# Patient Record
Sex: Female | Born: 2000 | Hispanic: No | Marital: Single | Smoking: Never smoker
Health system: Southern US, Community
[De-identification: ages and names within clinical notes are randomized; demographics above are authoritative.]

## PROBLEM LIST (undated history)

## (undated) ENCOUNTER — Ambulatory Visit: Admission: EM | Payer: Self-pay

## (undated) DIAGNOSIS — F419 Anxiety disorder, unspecified: Secondary | ICD-10-CM

## (undated) HISTORY — DX: Anxiety disorder, unspecified: F41.9

---
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
## 2018-01-03 ENCOUNTER — Encounter (INDEPENDENT_AMBULATORY_CARE_PROVIDER_SITE_OTHER): Payer: Self-pay | Admitting: Pediatric Gastroenterology

## 2018-02-17 ENCOUNTER — Other Ambulatory Visit: Payer: Self-pay

## 2018-02-17 ENCOUNTER — Ambulatory Visit (INDEPENDENT_AMBULATORY_CARE_PROVIDER_SITE_OTHER): Admitting: *Deleted

## 2018-02-17 DIAGNOSIS — Z3042 Encounter for surveillance of injectable contraceptive: Secondary | ICD-10-CM

## 2018-05-20 ENCOUNTER — Other Ambulatory Visit: Payer: Self-pay

## 2018-05-20 ENCOUNTER — Ambulatory Visit (INDEPENDENT_AMBULATORY_CARE_PROVIDER_SITE_OTHER)

## 2018-05-20 ENCOUNTER — Other Ambulatory Visit: Payer: Self-pay | Admitting: Family Medicine

## 2018-05-20 DIAGNOSIS — Z3042 Encounter for surveillance of injectable contraceptive: Secondary | ICD-10-CM

## 2018-05-20 MED ORDER — MEDROXYPROGESTERONE ACETATE 150 MG/ML IM SUSP: 150.0000 mg | Freq: Once | INTRAMUSCULAR | 0 refills | Status: DC

## 2018-08-19 ENCOUNTER — Ambulatory Visit (INDEPENDENT_AMBULATORY_CARE_PROVIDER_SITE_OTHER)

## 2018-08-19 DIAGNOSIS — Z3042 Encounter for surveillance of injectable contraceptive: Secondary | ICD-10-CM | POA: Diagnosis not present

## 2018-08-19 NOTE — Progress Notes (Signed)
Patient came in today for 3 month depo provera injections. Depo provera was given in the left deltoid. Patient tolerated well. Informed her next one due 11/04/18-11/18/2018.

## 2018-08-29 ENCOUNTER — Ambulatory Visit: Admitting: Physician Assistant

## 2018-08-30 ENCOUNTER — Ambulatory Visit (INDEPENDENT_AMBULATORY_CARE_PROVIDER_SITE_OTHER): Admitting: Family Medicine

## 2018-08-30 ENCOUNTER — Encounter: Payer: Self-pay | Admitting: Family Medicine

## 2018-08-30 ENCOUNTER — Other Ambulatory Visit: Payer: Self-pay

## 2018-08-30 VITALS — BP 118/60 | HR 89 | Temp 98.8°F | Resp 14 | Ht 68.0 in | Wt 174.0 lb

## 2018-08-30 DIAGNOSIS — N760 Acute vaginitis: Secondary | ICD-10-CM

## 2018-08-30 DIAGNOSIS — Z113 Encounter for screening for infections with a predominantly sexual mode of transmission: Secondary | ICD-10-CM

## 2018-08-30 LAB — WET PREP FOR TRICH, YEAST, CLUE

## 2018-08-30 MED ORDER — METRONIDAZOLE 500 MG PO TABS: 500.0000 mg | ORAL_TABLET | Freq: Two times a day (BID) | ORAL | 0 refills | Status: AC

## 2018-08-30 MED ORDER — FLUCONAZOLE 150 MG PO TABS: 150.0000 mg | ORAL_TABLET | Freq: Once | ORAL | 1 refills | Status: AC

## 2018-08-30 NOTE — Progress Notes (Signed)
Patient ID: Christina Conner, female    DOB: May 16, 2001, 17 y.o.   MRN: [id number]  PCP: Salley Scarlet, MD  Chief Complaint  Patient presents with  . Vaginitis    x2 weeks- thought she had yeast infection and triedc OTC meds with no relief  . STD Check    Subjective:   Christina Conner is a 17 y.o. female, presents to clinic with CC of vaginitis x 2 weeks Vaginal Pain  The patient's primary symptoms include genital itching and vaginal discharge. The patient's pertinent negatives include no genital lesions, genital odor, genital rash, missed menses, pelvic pain or vaginal bleeding. This is a new problem. The current episode started 1 to 4 weeks ago (2 weeks). The problem has been unchanged. The pain is moderate. The problem affects both sides. She is not pregnant. Associated symptoms include dysuria (burning to genital skin with urination). Pertinent negatives include no abdominal pain, back pain, chills, diarrhea, discolored urine, fever, flank pain, frequency, hematuria, joint pain, joint swelling, nausea, painful intercourse, rash, sore throat, urgency or vomiting. The vaginal discharge was scant and white. There has been no bleeding. She has not been passing clots. She has not been passing tissue. Nothing aggravates the symptoms. She has tried antifungals (AZO yeast infection pills) for the symptoms. The treatment provided no relief. She is sexually active. It is possible that her partner has an STD. She uses condoms for contraception. Her menstrual history has been irregular (on depo provera birth control shot). Her past medical history is significant for vaginosis. There is no history of an abdominal surgery, a Cesarean section, an ectopic pregnancy, a gynecological surgery, PID, an STD or a terminated pregnancy.   Vaginal intercourse with one female partner 3 weeks ago, used condoms, pt unsure if she has had other recent partners.  Her symptoms began 1 week after mostly vaginal irritation and  itching and burning.  She has had BV in the past but this is not similar to that.  She does suspected to yeast infection but over-the-counter medications have not improved her symptoms.  She denies any fever, nausea, vomiting, back pain, abdominal pain, pelvic pain, anorexia, hot cold chills or sweats.   Patient Active Problem List   Diagnosis Date Noted  . Tinea versicolor 06/09/2016  . Contraception management 12/18/2013  . Chronic constipation 12/18/2013  . Other malaise and fatigue 04/19/2013  . Recurrent upper abdominal pain 04/19/2013  . Dizziness and giddiness 04/19/2013     Prior to Admission medications   Medication Sig Start Date End Date Taking? Authorizing Provider  colchicine 0.6 MG tablet For Gout Flare: Take 2. Then take 1 one hour later. Then take 1 twice a day until flare resolves. 07/22/17  Yes Dorena Bodo, PA-C  medroxyPROGESTERone (DEPO-PROVERA) 150 MG/ML injection Inject 1 mL (150 mg total) into the muscle once for 1 dose. 05/20/18 05/20/18  Salley Scarlet, MD  valACYclovir (VALTREX) 1000 MG tablet For 3 days Patient not taking: Reported on 08/30/2018 05/03/17   Salley Scarlet, MD     No Known Allergies   History reviewed. No pertinent family history.   Social History   Socioeconomic History  . Marital status: Single    Spouse name: Not on file  . Number of children: Not on file  . Years of education: Not on file  . Highest education level: Not on file  Occupational History  . Not on file  Social Needs  . Financial resource strain: Not  on file  . Food insecurity:    Worry: Not on file    Inability: Not on file  . Transportation needs:    Medical: Not on file    Non-medical: Not on file  Tobacco Use  . Smoking status: Passive Smoke Exposure - Never Smoker  . Smokeless tobacco: Never Used  Substance and Sexual Activity  . Alcohol use: No    Alcohol/week: 0.0 standard drinks  . Drug use: No  . Sexual activity: Never  Lifestyle  . Physical  activity:    Days per week: Not on file    Minutes per session: Not on file  . Stress: Not on file  Relationships  . Social connections:    Talks on phone: Not on file    Gets together: Not on file    Attends religious service: Not on file    Active member of club or organization: Not on file    Attends meetings of clubs or organizations: Not on file    Relationship status: Not on file  . Intimate partner violence:    Fear of current or ex partner: Not on file    Emotionally abused: Not on file    Physically abused: Not on file    Forced sexual activity: Not on file  Other Topics Concern  . Not on file  Social History Narrative  . Not on file     Review of Systems  Constitutional: Negative.  Negative for activity change, appetite change, chills, diaphoresis, fatigue, fever and unexpected weight change.  HENT: Negative.  Negative for sore throat.   Eyes: Negative.   Respiratory: Negative.   Cardiovascular: Negative.   Gastrointestinal: Negative.  Negative for abdominal pain, diarrhea, nausea and vomiting.  Endocrine: Negative.   Genitourinary: Positive for dysuria (burning to genital skin with urination), vaginal discharge and vaginal pain. Negative for flank pain, frequency, hematuria, missed menses, pelvic pain and urgency.  Musculoskeletal: Negative.  Negative for back pain and joint pain.  Skin: Negative.  Negative for color change, pallor, rash and wound.  Allergic/Immunologic: Negative.   Neurological: Negative.   Hematological: Negative.   Psychiatric/Behavioral: Negative.   All other systems reviewed and are negative.      Objective:    Vitals:   08/30/18 1141  BP: 118/60  Pulse: 89  Resp: 14  Temp: 98.8 F (37.1 C)  TempSrc: Oral  SpO2: 98%  Weight: 174 lb (78.9 kg)  Height: 5\' 8"  (1.727 m)      Physical Exam  Constitutional: She appears well-developed and well-nourished. No distress.  HENT:  Head: Normocephalic and atraumatic.  Nose: Nose  normal.  Mouth/Throat: Oropharynx is clear and moist. No oropharyngeal exudate.  Eyes: Conjunctivae are normal. Right eye exhibits no discharge. Left eye exhibits no discharge.  Neck: No tracheal deviation present.  Cardiovascular: Normal rate, regular rhythm, normal heart sounds and intact distal pulses. Exam reveals no gallop and no friction rub.  No murmur heard. Pulmonary/Chest: Effort normal and breath sounds normal. No stridor. No respiratory distress. She has no wheezes. She has no rales. She exhibits no tenderness.  Abdominal: Soft. Bowel sounds are normal. She exhibits no distension and no mass. There is no tenderness. There is no rebound and no guarding.  No CVA tenderness bilaterally  Genitourinary: Vagina normal. Pelvic exam was performed with patient supine. No labial fusion. There is no rash, tenderness, lesion or injury on the right labia. There is no rash, tenderness, lesion or injury on the left labia. No  erythema, tenderness or bleeding in the vagina. No signs of injury around the vagina. No vaginal discharge found.  Genitourinary Comments: Vaginal swabs for wet prep and STD's obtained with chaperon Christina Six, LPN  Musculoskeletal: Normal range of motion.  Lymphadenopathy: No inguinal adenopathy noted on the right or left side.  Neurological: She is alert. She exhibits normal muscle tone. Coordination normal.  Skin: Skin is warm and dry. No rash noted. She is not diaphoretic.  Psychiatric: She has a normal mood and affect. Her behavior is normal.  Nursing note and vitals reviewed.   WET PREP FOR TRICH, YEAST, CLUE  Order: [id number]  Status:  Final result Visible to patient:  No (Not Released) Next appt:  None Dx:  Acute vaginitis  Specimen Information: Vaginal Mucosa; Body Fluid     Component 12:09  Source: GENITAL   RESULT   Comment: TRICHOMONAS-NONE SEEN  YEAST-NONE SEEN  CLUE CELLS-PRESENT  EPITHELIAL CELLS-PRESENT  BACTERIA-MODERATE  WBCS-FEW               Assessment & Plan:      ICD-10-CM   1. Acute vaginitis N76.0 Trichomonas vaginalis, RNA    C. trachomatis/N. gonorrhoeae RNA    WET PREP FOR TRICH, YEAST, CLUE    fluconazole (DIFLUCAN) 150 MG tablet    metroNIDAZOLE (FLAGYL) 500 MG tablet  2. Screen for STD (sexually transmitted disease) Z11.3 HIV Antibody (routine testing w rflx)    RPR    Patient with 2 weeks of vaginitis with burning and itching, following sex with a female partner 3 weeks ago, reports to have used condoms as protection, patient does wish for STD testing today.  Her physical exam is fairly unremarkable, no abdominal, pelvic or vaginal pain, do not suspect PID given her presentation and physical exam.  Will cover for yeast and BV for her symptoms and wet prep results.  She will be called with remaining STD results.   Danelle Berry, PA-C 08/30/18 11:54 AM

## 2018-08-31 LAB — HIV ANTIBODY (ROUTINE TESTING W REFLEX): HIV: NONREACTIVE

## 2018-08-31 LAB — RPR: RPR: NONREACTIVE

## 2018-08-31 LAB — C. TRACHOMATIS/N. GONORRHOEAE RNA
C. trachomatis RNA, TMA: NOT DETECTED
N. GONORRHOEAE RNA, TMA: NOT DETECTED

## 2018-08-31 LAB — TRICHOMONAS VAGINALIS, PROBE AMP: Trichomonas vaginalis RNA: NOT DETECTED

## 2018-11-21 ENCOUNTER — Ambulatory Visit (INDEPENDENT_AMBULATORY_CARE_PROVIDER_SITE_OTHER)

## 2018-11-21 ENCOUNTER — Other Ambulatory Visit: Payer: Self-pay | Admitting: Family Medicine

## 2018-11-21 DIAGNOSIS — Z3042 Encounter for surveillance of injectable contraceptive: Secondary | ICD-10-CM

## 2018-11-21 NOTE — Progress Notes (Signed)
Patient came in and to receive depo provera. Depo provera was given in the right deltoid. Patient tolerated well. Informed patient to return March 24th- April 7th.

## 2019-01-09 ENCOUNTER — Other Ambulatory Visit: Payer: Self-pay

## 2019-01-09 ENCOUNTER — Ambulatory Visit (INDEPENDENT_AMBULATORY_CARE_PROVIDER_SITE_OTHER): Admitting: Family Medicine

## 2019-01-09 ENCOUNTER — Encounter: Payer: Self-pay | Admitting: Family Medicine

## 2019-01-09 VITALS — BP 112/62 | HR 80 | Temp 98.2°F | Resp 16 | Ht 68.0 in | Wt 184.0 lb

## 2019-01-09 DIAGNOSIS — H547 Unspecified visual loss: Secondary | ICD-10-CM

## 2019-01-09 DIAGNOSIS — R635 Abnormal weight gain: Secondary | ICD-10-CM

## 2019-01-09 DIAGNOSIS — G44309 Post-traumatic headache, unspecified, not intractable: Secondary | ICD-10-CM

## 2019-01-09 DIAGNOSIS — R5383 Other fatigue: Secondary | ICD-10-CM | POA: Diagnosis not present

## 2019-01-09 MED ORDER — NAPROXEN 500 MG PO TABS: 500.0000 mg | ORAL_TABLET | Freq: Two times a day (BID) | ORAL | 0 refills | Status: DC | PRN
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
## 2019-01-09 NOTE — Patient Instructions (Addendum)
Take naprosyn  We will call with lab results Recommend eye visit  F/U pending results

## 2019-01-10 LAB — CBC WITH DIFFERENTIAL/PLATELET
Absolute Monocytes: 640 cells/uL (ref 200–950)
BASOS ABS: 32 {cells}/uL (ref 0–200)
Basophils Relative: 0.4 %
EOS PCT: 0.8 %
Eosinophils Absolute: 63 cells/uL (ref 15–500)
HCT: 39.7 % (ref 35.0–45.0)
Hemoglobin: 13.3 g/dL (ref 11.7–15.5)
Lymphs Abs: 2125 cells/uL (ref 850–3900)
MCH: 26.6 pg — ABNORMAL LOW (ref 27.0–33.0)
MCHC: 33.5 g/dL (ref 32.0–36.0)
MCV: 79.4 fL — ABNORMAL LOW (ref 80.0–100.0)
MPV: 9.7 fL (ref 7.5–12.5)
Monocytes Relative: 8.1 %
Neutro Abs: 5040 cells/uL (ref 1500–7800)
Neutrophils Relative %: 63.8 %
Platelets: 335 10*3/uL (ref 140–400)
RBC: 5 10*6/uL (ref 3.80–5.10)
RDW: 13.4 % (ref 11.0–15.0)
Total Lymphocyte: 26.9 %
WBC: 7.9 10*3/uL (ref 3.8–10.8)

## 2019-01-10 LAB — COMPREHENSIVE METABOLIC PANEL
AG Ratio: 1.7 (calc) (ref 1.0–2.5)
ALT: 20 U/L (ref 5–32)
AST: 18 U/L (ref 12–32)
Albumin: 4.4 g/dL (ref 3.6–5.1)
Alkaline phosphatase (APISO): 88 U/L (ref 36–128)
BUN: 13 mg/dL (ref 7–20)
CO2: 23 mmol/L (ref 20–32)
Calcium: 9.2 mg/dL (ref 8.9–10.4)
Chloride: 107 mmol/L (ref 98–110)
Creat: 0.77 mg/dL (ref 0.50–1.00)
Globulin: 2.6 g/dL (calc) (ref 2.0–3.8)
Glucose, Bld: 101 mg/dL — ABNORMAL HIGH (ref 65–99)
POTASSIUM: 4.6 mmol/L (ref 3.8–5.1)
Sodium: 142 mmol/L (ref 135–146)
Total Bilirubin: 0.3 mg/dL (ref 0.2–1.1)
Total Protein: 7 g/dL (ref 6.3–8.2)

## 2019-01-10 LAB — TSH: TSH: 0.93 mIU/L

## 2019-02-13 ENCOUNTER — Other Ambulatory Visit: Payer: Self-pay

## 2019-02-13 ENCOUNTER — Encounter (INDEPENDENT_AMBULATORY_CARE_PROVIDER_SITE_OTHER): Admitting: Family Medicine

## 2019-02-13 NOTE — Progress Notes (Signed)
Virtual Visit via Telephone Note  I connected with Christina Conner on 02/13/19 at  by telephone and verified that I am speaking with the correct person using two identifiers.   I discussed the limitations, risks, security and privacy concerns of performing an evaluation and management service by telephone and the availability of in person appointments. I also discussed with the patient that there may be a patient responsible charge related to this service. The patient expressed understanding and agreed to proceed.   [phone]- Called  Patient twice and home number, father states she is at work   pt did not answer, can reschedule call

## 2019-02-16 ENCOUNTER — Ambulatory Visit (INDEPENDENT_AMBULATORY_CARE_PROVIDER_SITE_OTHER): Admitting: *Deleted

## 2019-02-16 ENCOUNTER — Other Ambulatory Visit: Payer: Self-pay

## 2019-02-16 ENCOUNTER — Other Ambulatory Visit: Payer: Self-pay | Admitting: Family Medicine

## 2019-02-16 DIAGNOSIS — Z3042 Encounter for surveillance of injectable contraceptive: Secondary | ICD-10-CM

## 2019-07-18 ENCOUNTER — Other Ambulatory Visit: Payer: Self-pay

## 2019-07-18 ENCOUNTER — Ambulatory Visit (INDEPENDENT_AMBULATORY_CARE_PROVIDER_SITE_OTHER): Admitting: Family Medicine

## 2019-07-18 ENCOUNTER — Encounter: Payer: Self-pay | Admitting: Family Medicine

## 2019-07-18 VITALS — BP 98/68 | HR 92 | Temp 98.5°F | Resp 16 | Ht 68.0 in | Wt 167.2 lb

## 2019-07-18 DIAGNOSIS — N898 Other specified noninflammatory disorders of vagina: Secondary | ICD-10-CM

## 2019-07-18 DIAGNOSIS — N390 Urinary tract infection, site not specified: Secondary | ICD-10-CM | POA: Diagnosis not present

## 2019-07-18 LAB — WET PREP FOR TRICH, YEAST, CLUE

## 2019-07-18 LAB — URINALYSIS, ROUTINE W REFLEX MICROSCOPIC
Bilirubin Urine: NEGATIVE
Glucose, UA: NEGATIVE
Hgb urine dipstick: NEGATIVE
Ketones, ur: NEGATIVE
Leukocytes,Ua: NEGATIVE
Nitrite: NEGATIVE
Protein, ur: NEGATIVE
Specific Gravity, Urine: 1.021 (ref 1.001–1.03)
pH: 8.5 — ABNORMAL HIGH (ref 5.0–8.0)

## 2019-07-18 NOTE — Progress Notes (Signed)
   Subjective:    Patient ID: Christina Conner, female    DOB: 09/22/2001, 18 y.o.   MRN: [id number]  Patient presents for Vaginitis   Wend to UC in May had UTI, had odor last week. That has went away     Stopped Depo In July, has not had cycle since then  No abd pain or cramping   Vaginal discharge for a week, then it cleared up but wants to be checked, she has a girlfriend now    Declines     flu shot      Review Of Systems:  GEN- denies fatigue, fever, weight loss,weakness, recent illness HEENT- denies eye drainage, change in vision, nasal discharge, CVS- denies chest pain, palpitations RESP- denies SOB, cough, wheeze ABD- denies N/V, change in stools, abd pain GU- denies dysuria, hematuria, dribbling, incontinence MSK- denies joint pain, muscle aches, injury Neuro- denies headache, dizziness, syncope, seizure activity       Objective:    BP 98/68 (BP Location: Right Arm, Patient Position: Sitting, Cuff Size: Normal)   Pulse 92   Temp 98.5 F (36.9 C) (Oral)   Resp 16   Ht 5\' 8"  (1.727 m)   Wt 167 lb 3.2 oz (75.8 kg)   SpO2 98%   BMI 25.42 kg/m  GEN- NAD, alert and oriented x3 HEENT- PERRL, EOMI, non injected sclera, pink conjunctiva, MMM, oropharynx clear CVS- RRR, no murmur RESP-CTAB ABD-NABS,soft,NT,ND, no CVA tenderness Nodes- no axillary nodes GU- normal external genitalia, vaginal mucosa pink and moist, cervix visualized no growth, no blood form os, +  discharge, no CMT, no ovarian masses, uterus normal size EXT- No edema Pulses- Radial, DP- 2+        Assessment & Plan:      Problem List Items Addressed This Visit    None    Visit Diagnoses    Vaginal discharge    -  Primary   check wet prep, GC, not currently symptomatic, check urine ensure UTI cleared as well, declines further contraception, Depo still wearing off   Relevant Orders   C. trachomatis/N. gonorrhoeae RNA   WET PREP FOR [place], YEAST, CLUE (Completed)   Urinary tract infection  without hematuria, site unspecified       Relevant Orders   Urinalysis, Routine w reflex microscopic (Completed)      Note: This dictation was prepared with Dragon dictation along with smaller phrase technology. Any transcriptional errors that result from this process are unintentional.

## 2019-07-18 NOTE — Patient Instructions (Signed)
We will call with lab results  F/U as needed 

## 2019-07-19 ENCOUNTER — Other Ambulatory Visit: Payer: Self-pay

## 2019-07-19 LAB — C. TRACHOMATIS/N. GONORRHOEAE RNA
C. trachomatis RNA, TMA: DETECTED — AB
N. gonorrhoeae RNA, TMA: NOT DETECTED

## 2019-07-19 MED ORDER — AZITHROMYCIN 500 MG PO TABS: 1000.0000 mg | ORAL_TABLET | ORAL | 0 refills | Status: DC

## 2019-07-31 ENCOUNTER — Other Ambulatory Visit: Payer: Self-pay

## 2019-08-01 ENCOUNTER — Ambulatory Visit (INDEPENDENT_AMBULATORY_CARE_PROVIDER_SITE_OTHER): Admitting: Family Medicine

## 2019-08-01 ENCOUNTER — Encounter: Payer: Self-pay | Admitting: Family Medicine

## 2019-08-01 VITALS — BP 118/64 | HR 82 | Temp 98.6°F | Resp 14 | Ht 68.0 in | Wt 165.0 lb

## 2019-08-01 DIAGNOSIS — Z8619 Personal history of other infectious and parasitic diseases: Secondary | ICD-10-CM | POA: Diagnosis not present

## 2019-08-01 DIAGNOSIS — N898 Other specified noninflammatory disorders of vagina: Secondary | ICD-10-CM | POA: Diagnosis not present

## 2019-08-01 DIAGNOSIS — Z113 Encounter for screening for infections with a predominantly sexual mode of transmission: Secondary | ICD-10-CM | POA: Diagnosis not present

## 2019-08-01 LAB — WET PREP FOR TRICH, YEAST, CLUE

## 2019-08-01 NOTE — Progress Notes (Signed)
   Subjective:    Patient ID: Christina Conner, female    DOB: [birth year], 18 y.o.   MRN: [id number]  Patient presents for Repeat Testing   Pt here for further STD screening, had positive Chlamydia 2 weeks ago, had diffefrent partner before her current female partner. Still has some discharge, no itching or burning. Had blood tinged discharge 2 days after taking the azithromycin but that has cleared. Previously on Depo, has not had full period since her last shot which was in April.  Denies any sexual activity with any female partners does not feel like she could be pregnant      Review Of Systems:  GEN- denies fatigue, fever, weight loss,weakness, recent illness HEENT- denies eye drainage, change in vision, nasal discharge, CVS- denies chest pain, palpitations RESP- denies SOB, cough, wheeze ABD- denies N/V, change in stools, abd pain GU- denies dysuria, hematuria, dribbling, incontinence MSK- denies joint pain, muscle aches, injury Neuro- denies headache, dizziness, syncope, seizure activity       Objective:    BP 118/64   Pulse 82   Temp 98.6 F (37 C) (Oral)   Resp 14   Ht 5\' 8"  (1.727 m)   Wt 165 lb (74.8 kg)   SpO2 99%   BMI 25.09 kg/m  GEN- NAD, alert and oriented x3 HEENT- PERRL, EOMI, non injected sclera, pink conjunctiva, MMM, oropharynx clear CVS- RRR, no murmur RESP-CTAB ABD-NABS,soft,NT,NDy GU- normal external genitalia, vaginal mucosa pink and moist, cervix visualized no growth, no blood form os, + white  discharge, no CMT, no ovarian masses, uterus normal size Pulses- Radial, 2+        Assessment & Plan:      Problem List Items Addressed This Visit    None    Visit Diagnoses    History of chlamydia infection    -  Primary   S/P treatment still with discharge, recheck cultures, HIV/RPR. Expect menses to start within next month    Relevant Orders   WET PREP FOR [place], YEAST, [place] (Completed)   C. trachomatis/N. gonorrhoeae RNA   Screen for STD  (sexually transmitted disease)       Relevant Orders   WET PREP FOR [place], [place] (Completed)   C. trachomatis/N. gonorrhoeae RNA   HIV Antibody (routine testing w rflx)   RPR      Note: This dictation was prepared with Dragon dictation along with smaller phrase technology. Any transcriptional errors that result from this process are unintentional.

## 2019-08-01 NOTE — Patient Instructions (Signed)
F/U pending results   

## 2019-08-02 ENCOUNTER — Encounter: Payer: Self-pay | Admitting: Family Medicine

## 2019-08-02 LAB — RPR: RPR Ser Ql: NONREACTIVE

## 2019-08-02 LAB — HIV ANTIBODY (ROUTINE TESTING W REFLEX): HIV 1&2 Ab, 4th Generation: NONREACTIVE

## 2019-08-02 LAB — C. TRACHOMATIS/N. GONORRHOEAE RNA
C. trachomatis RNA, TMA: NOT DETECTED
N. gonorrhoeae RNA, TMA: NOT DETECTED

## 2019-10-19 ENCOUNTER — Other Ambulatory Visit: Payer: Self-pay

## 2019-10-19 ENCOUNTER — Ambulatory Visit (INDEPENDENT_AMBULATORY_CARE_PROVIDER_SITE_OTHER): Admitting: Family Medicine

## 2019-10-19 ENCOUNTER — Encounter: Payer: Self-pay | Admitting: Family Medicine

## 2019-10-19 VITALS — BP 146/92 | HR 88 | Temp 96.6°F | Resp 16 | Ht 67.0 in | Wt 160.0 lb

## 2019-10-19 DIAGNOSIS — N760 Acute vaginitis: Secondary | ICD-10-CM | POA: Diagnosis not present

## 2019-10-19 LAB — WET PREP FOR TRICH, YEAST, CLUE

## 2019-10-19 MED ORDER — METRONIDAZOLE 500 MG PO TABS: 500.0000 mg | ORAL_TABLET | Freq: Two times a day (BID) | ORAL | 0 refills | Status: DC

## 2019-10-19 NOTE — Addendum Note (Signed)
Addended by: Shary Decamp B on: 10/19/2019 04:05 PM   Modules accepted: Orders

## 2019-10-19 NOTE — Progress Notes (Signed)
Subjective:    Patient ID: Christina Conner, female    DOB: 2000-12-21, 18 y.o.   MRN: [id number]  HPI  Patient is a very pleasant 18 year old female here today complaining of foul-smelling discharge.  She also reports a strong odor whenever she urinates.  She denies any dysuria.  She denies any urgency or frequency.  She denies any hematuria.  She denies any pelvic pain.  Pelvic exam was performed today with a chaperone present and the patient had copious amounts of foul-smelling vaginal discharge but no adnexal or cervical motion tenderness.  Wet prep did show numerous clue cells.  Gonorrhea and Chlamydia testing was also sent but is not back at the time of this dictation. No past medical history on file. Past Surgical History:  Procedure Laterality Date  . EYE SURGERY Left 07/17/2014   No current outpatient medications on file prior to visit.   No current facility-administered medications on file prior to visit.    No Known Allergies Social History   Socioeconomic History  . Marital status: Single    Spouse name: Not on file  . Number of children: Not on file  . Years of education: Not on file  . Highest education level: Not on file  Occupational History  . Not on file  Social Needs  . Financial resource strain: Not on file  . Food insecurity    Worry: Not on file    Inability: Not on file  . Transportation needs    Medical: Not on file    Non-medical: Not on file  Tobacco Use  . Smoking status: Passive Smoke Exposure - Never Smoker  . Smokeless tobacco: Never Used  Substance and Sexual Activity  . Alcohol use: No    Alcohol/week: 0.0 standard drinks  . Drug use: No  . Sexual activity: Never  Lifestyle  . Physical activity    Days per week: Not on file    Minutes per session: Not on file  . Stress: Not on file  Relationships  . Social Musician on phone: Not on file    Gets together: Not on file    Attends religious service: Not on file    Active member  of club or organization: Not on file    Attends meetings of clubs or organizations: Not on file    Relationship status: Not on file  . Intimate partner violence    Fear of current or ex partner: Not on file    Emotionally abused: Not on file    Physically abused: Not on file    Forced sexual activity: Not on file  Other Topics Concern  . Not on file  Social History Narrative  . Not on file     Review of Systems  All other systems reviewed and are negative.      Objective:   Physical Exam Vitals signs reviewed. Exam conducted with a chaperone present.  Cardiovascular:     Rate and Rhythm: Normal rate and regular rhythm.  Pulmonary:     Effort: Pulmonary effort is normal.     Breath sounds: Normal breath sounds.  Abdominal:     General: Abdomen is flat. Bowel sounds are normal. There is no distension.     Palpations: Abdomen is soft.     Tenderness: There is no abdominal tenderness.  Genitourinary:    Exam position: Lithotomy position.     Vagina: Vaginal discharge present.     Cervix: Discharge present.  Uterus: Normal.      Adnexa: Right adnexa normal and left adnexa normal.           Assessment & Plan:  Acute vaginitis - Plan: WET PREP FOR TRICH, YEAST, CLUE, C. trachomatis/N. gonorrhoeae RNA  Symptoms appear to be consistent with bacterial vaginosis.  Begin Flagyl 500 mg p.o. twice daily for 7 days.  Consider Hylafem if symptoms become recurrent after menses.

## 2019-10-20 LAB — C. TRACHOMATIS/N. GONORRHOEAE RNA
C. trachomatis RNA, TMA: NOT DETECTED
N. gonorrhoeae RNA, TMA: NOT DETECTED

## 2020-01-23 ENCOUNTER — Ambulatory Visit (INDEPENDENT_AMBULATORY_CARE_PROVIDER_SITE_OTHER): Admitting: Family Medicine

## 2020-01-23 ENCOUNTER — Other Ambulatory Visit: Payer: Self-pay

## 2020-01-23 ENCOUNTER — Encounter: Payer: Self-pay | Admitting: Family Medicine

## 2020-01-23 DIAGNOSIS — F411 Generalized anxiety disorder: Secondary | ICD-10-CM | POA: Diagnosis not present

## 2020-01-23 DIAGNOSIS — F321 Major depressive disorder, single episode, moderate: Secondary | ICD-10-CM | POA: Diagnosis not present

## 2020-01-23 DIAGNOSIS — F329 Major depressive disorder, single episode, unspecified: Secondary | ICD-10-CM | POA: Insufficient documentation

## 2020-01-23 MED ORDER — FLUOXETINE HCL 20 MG PO TABS: 20.0000 mg | ORAL_TABLET | Freq: Every day | ORAL | 3 refills | Status: DC

## 2020-01-23 NOTE — Progress Notes (Signed)
    Subjective:    Patient ID: Christina Conner, female    DOB: 13-Sep-2001, 18 y.o.   MRN: [id number]  Patient presents for Therapy Referral (states that she has struggled with depression and would like to go to therapy )   Pt here with depression and anxiety symptoms. States she has battled with this for many years but it is getting worse. She talked to her mother and girlfriend and both recommended she get help. When she is out in public she gets very anxious, feels like people are staring at her, heart races, she gets figity and feels like she needs to run out. At times she doesn't even want to be around her family because her anxiety gets the best of her. Other times feels sad and depressed and she doesn't know why. She has some stressors with working and school, but does very well in her college classes and on her job. Her sleep is so so, sometimes she cant get her mind off things She feels like she cant pinpoint one thing that makes her feel depressed and anxious     Review Of Systems:  GEN- denies fatigue, fever, weight loss,weakness, recent illness HEENT- denies eye drainage, change in vision, nasal discharge, CVS- denies chest pain, palpitations RESP- denies SOB, cough, wheeze ABD- denies N/V, change in stools, abd pain GU- denies dysuria, hematuria, dribbling, incontinence MSK- denies joint pain, muscle aches, injury Neuro- denies headache, dizziness, syncope, seizure activity       Objective:    BP 114/66   Pulse 66   Temp 98.2 F (36.8 C) (Temporal)   Resp 14   Ht 5\' 7"  (1.702 m)   Wt 165 lb (74.8 kg)   LMP 12/26/2019 Comment: regular  SpO2 100%   BMI 25.84 kg/m  GEN- NAD, alert and oriented x3 HEENT- PERRL, EOMI, non injected sclera, pink conjunctiva, MMM, oropharynx clear Neck- Supple, no thyromegaly CVS- RRR, no murmur RESP-CTAB Psych- pleasant, anxious appearing, not depressed, not tearful, good eye contact       Assessment & Plan:      Problem List  Items Addressed This Visit      Unprioritized   GAD (generalized anxiety disorder)   Relevant Medications   FLUoxetine (PROZAC) 20 MG tablet   Other Relevant Orders   Ambulatory referral to Psychiatry   MDD (major depressive disorder)    We discussed medications and therapy She is contemplating therapy but okay with me starting the process She has family history of mental health disorders including bipolar in her mother  Mother is on prozac and does well with this Will start her onprozac 20mg  once a day  F/U in a few weeks in office       Relevant Medications   FLUoxetine (PROZAC) 20 MG tablet   Other Relevant Orders   Ambulatory referral to Psychiatry      Note: This dictation was prepared with Dragon dictation along with smaller phrase technology. Any transcriptional errors that result from this process are unintentional.

## 2020-01-23 NOTE — Patient Instructions (Signed)
F/u 4 WEEKS for medications  Referral to therapy

## 2020-01-24 ENCOUNTER — Encounter: Payer: Self-pay | Admitting: Family Medicine

## 2020-01-24 NOTE — Assessment & Plan Note (Signed)
We discussed medications and therapy She is contemplating therapy but okay with me starting the process She has family history of mental health disorders including bipolar in her mother  Mother is on prozac and does well with this Will start her onprozac 20mg  once a day  F/U in a few weeks in office

## 2020-02-20 ENCOUNTER — Ambulatory Visit: Admitting: Family Medicine

## 2020-03-05 ENCOUNTER — Other Ambulatory Visit: Payer: Self-pay

## 2020-03-05 ENCOUNTER — Encounter: Payer: Self-pay | Admitting: Family Medicine

## 2020-03-05 ENCOUNTER — Ambulatory Visit (INDEPENDENT_AMBULATORY_CARE_PROVIDER_SITE_OTHER): Admitting: Family Medicine

## 2020-03-05 VITALS — BP 104/68 | HR 69 | Temp 97.6°F | Resp 15 | Ht 67.0 in | Wt 164.1 lb

## 2020-03-05 DIAGNOSIS — F321 Major depressive disorder, single episode, moderate: Secondary | ICD-10-CM

## 2020-03-05 DIAGNOSIS — F411 Generalized anxiety disorder: Secondary | ICD-10-CM | POA: Diagnosis not present

## 2020-03-05 MED ORDER — SERTRALINE HCL 25 MG PO TABS: ORAL_TABLET | ORAL | 1 refills | Status: DC

## 2020-03-05 NOTE — Progress Notes (Signed)
   Subjective:    Patient ID: Christina Conner, female    DOB: May 11, 2001, 18 y.o.   MRN: [id number]  Patient presents for Depression Patient here to follow-up medication.  She was started on Prozac 1 month ago for depressed mood and anxiety.  She took the medication for possibly 1 month but actually felt worse than the medicine.  States that she felt more sad she had some suicidal thoughts a couple of times but no action or plan.  The medicine also made her nauseous.  So she stopped the medication for the past 2 weeks and her symptoms improved.  Prozac is what her mother was on and worked well for her.  She has not noted any mood swings otherwise.  There is a family history of bipolar depression.  She however would like to try something else to help with her symptoms.  She did consider psychotherapy but is still holding off right now that she does not want to open up to anyone else.  Expressed that she had an episode where her heart was racing and she had some palpitations chest discomfort that lasted for a few minutes while at work she was finally able to get it to calm down.  She believes it was an anxiety attack.  He is still working as well as in school full-time grades are good.     Objective:    BP 104/68   Pulse 69   Temp 97.6 F (36.4 C) (Temporal)   Resp 15   Ht 5\' 7"  (1.702 m)   Wt 164 lb 2 oz (74.4 kg)   SpO2 98%   BMI 25.71 kg/m  GEN- NAD, alert and oriented x3 HEENT- PERRL, EOMI, non injected sclera, pink conjunctiva, MMM, oropharynx clear Neck- Supple, no thyromegaly CVS- RRR, no murmur RESP-CTAB Psych normal affect and mood.  Pleasant EXT- No edema Pulses- Radial, DP- 2+        Assessment & Plan:      Problem List Items Addressed This Visit      Unprioritized   GAD (generalized anxiety disorder)   Relevant Medications   sertraline (ZOLOFT) 25 MG tablet   MDD (major depressive disorder) - Primary    D/C Prozac secondary to side effects.  We will try her on  Zoloft we will start with 25 mg for 2 weeks increase to 50 mg after 2 weeks.  If she has recurrent symptoms similar to the Prozac especially the worsening mood and suicidal ideations I query if she does have an underlying bipolar depression and I will refer to psychiatry for further evaluation and treatment.  Patient voiced understanding with this plan.  She will call if there is any issues with the medication.  If she otherwise does well I will see her back in 4 weeks      Relevant Medications   sertraline (ZOLOFT) 25 MG tablet      Note: This dictation was prepared with Dragon dictation along with smaller phrase technology. Any transcriptional errors that result from this process are unintentional.

## 2020-03-05 NOTE — Assessment & Plan Note (Signed)
D/C Prozac secondary to side effects.  We will try her on Zoloft we will start with 25 mg for 2 weeks increase to 50 mg after 2 weeks.  If she has recurrent symptoms similar to the Prozac especially the worsening mood and suicidal ideations I query if she does have an underlying bipolar depression and I will refer to psychiatry for further evaluation and treatment.  Patient voiced understanding with this plan.  She will call if there is any issues with the medication.  If she otherwise does well I will see her back in 4 weeks

## 2020-03-05 NOTE — Patient Instructions (Signed)
F/U 4 weeks  

## 2020-03-19 ENCOUNTER — Encounter: Payer: Self-pay | Admitting: Family Medicine

## 2020-03-19 ENCOUNTER — Ambulatory Visit (INDEPENDENT_AMBULATORY_CARE_PROVIDER_SITE_OTHER): Admitting: Family Medicine

## 2020-03-19 ENCOUNTER — Other Ambulatory Visit: Payer: Self-pay

## 2020-03-19 VITALS — BP 110/64 | HR 80 | Temp 98.8°F | Resp 14 | Ht 67.0 in | Wt 162.0 lb

## 2020-03-19 DIAGNOSIS — Z113 Encounter for screening for infections with a predominantly sexual mode of transmission: Secondary | ICD-10-CM

## 2020-03-19 DIAGNOSIS — Z124 Encounter for screening for malignant neoplasm of cervix: Secondary | ICD-10-CM | POA: Diagnosis not present

## 2020-03-19 LAB — WET PREP FOR TRICH, YEAST, CLUE

## 2020-03-19 NOTE — Progress Notes (Signed)
   Subjective:    Patient ID: Christina Conner, female    DOB: [birth year], 19 y.o.   MRN: [id number]  Patient presents for STD Check   Pt here for STD screening  Her Girlfriend had  2 bumps on her vaginal area and her girlfriend's mother told her that it was an STD that Ms. Inocencio gave her.  She is here to have everything screening as she is quite upset about this.  She does not have any symptoms herself She does not have any other partners   Due for PAP in 2 weeks     Review Of Systems:  GEN- denies fatigue, fever, weight loss,weakness, recent illness HEENT- denies eye drainage, change in vision, nasal discharge, CVS- denies chest pain, palpitations RESP- denies SOB, cough, wheeze ABD- denies N/V, change in stools, abd pain GU- denies dysuria, hematuria, dribbling, incontinence MSK- denies joint pain, muscle aches, injury Neuro- denies headache, dizziness, syncope, seizure activity       Objective:    BP 110/64   Pulse 80   Temp 98.8 F (37.1 C) (Temporal)   Resp 14   Ht 5\' 7"  (1.702 m)   Wt 162 lb (73.5 kg)   SpO2 97%   BMI 25.37 kg/m  GEN- NAD, alert and oriented x3 HEENT- PERRL, EOMI, non injected sclera, pink conjunctiva, MMM, oropharynx clear Neck- Supple, no thyromegaly CVS- RRR, no murmur RESP-CTAB ABD-NABS,soft,NT,ND GU- normal external genitalia, vaginal mucosa pink and moist, cervix visualized no growth, no blood form os, minimal thin clear discharge, no CMT, no ovarian masses, uterus normal size  EXT- No edema Pulses- Radial, DP- 2+        Assessment & Plan:     F/U 4 weeks for zoloft, tolerating meds   Problem List Items Addressed This Visit    None    Visit Diagnoses    Screen for STD (sexually transmitted disease)    -  Primary   No symptoms, screening done at pt request   Relevant Orders   HIV Antibody (routine testing w rflx)   RPR   HSV(herpes simplex vrs) 1+2 ab-IgG   WET PREP FOR [place], YEAST, CLUE (Completed)   C. trachomatis/N.  gonorrhoeae RNA   Cervical cancer screening       Relevant Orders   Pap IG w/ reflex to HPV when ASC-U      Note: This dictation was prepared with Dragon dictation along with smaller phrase technology. Any transcriptional errors that result from this process are unintentional.

## 2020-03-19 NOTE — Patient Instructions (Addendum)
We will call with results  F/U 4 weeks for meds

## 2020-03-20 ENCOUNTER — Encounter: Payer: Self-pay | Admitting: Family Medicine

## 2020-03-20 LAB — PAP IG W/ RFLX HPV ASCU

## 2020-03-20 LAB — HIV ANTIBODY (ROUTINE TESTING W REFLEX): HIV 1&2 Ab, 4th Generation: NONREACTIVE

## 2020-03-20 LAB — RPR: RPR Ser Ql: NONREACTIVE

## 2020-03-20 LAB — HSV(HERPES SIMPLEX VRS) I + II AB-IGG
HAV 1 IGG,TYPE SPECIFIC AB: 51.1 index — ABNORMAL HIGH
HSV 2 IGG,TYPE SPECIFIC AB: <0.9 index

## 2020-03-21 ENCOUNTER — Encounter: Payer: Self-pay | Admitting: Family Medicine

## 2020-03-22 LAB — C. TRACHOMATIS/N. GONORRHOEAE RNA
C. trachomatis RNA, TMA: NOT DETECTED
N. gonorrhoeae RNA, TMA: NOT DETECTED

## 2020-04-23 ENCOUNTER — Ambulatory Visit: Admitting: Family Medicine

## 2020-12-30 ENCOUNTER — Ambulatory Visit: Payer: Self-pay | Admitting: Family Medicine

## 2020-12-30 ENCOUNTER — Other Ambulatory Visit: Payer: Self-pay

## 2020-12-30 VITALS — BP 120/80 | HR 65 | Temp 97.6°F | Ht 67.0 in | Wt 143.0 lb

## 2020-12-30 DIAGNOSIS — F321 Major depressive disorder, single episode, moderate: Secondary | ICD-10-CM

## 2020-12-30 DIAGNOSIS — F411 Generalized anxiety disorder: Secondary | ICD-10-CM

## 2020-12-30 MED ORDER — ALPRAZOLAM 0.5 MG PO TABS: 0.5000 mg | ORAL_TABLET | Freq: Three times a day (TID) | ORAL | 0 refills | Status: DC | PRN

## 2020-12-30 MED ORDER — ESCITALOPRAM OXALATE 10 MG PO TABS: 10.0000 mg | ORAL_TABLET | Freq: Every day | ORAL | 2 refills | Status: DC

## 2020-12-30 NOTE — Progress Notes (Signed)
Subjective:    Patient ID: Christina Conner, female    DOB: 2000-12-26, 20 y.o.   MRN: [id number]  HPI  Patient is a very pleasant 20 year old African-American female who presents today for depression and anxiety.  She states that she constantly feels anxious to leave her home.  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  She is able to work however often during the day she will have panic attacks.  She states that other than work, she feels unsafe leaving her home.  She is very scared of being in large crowds.  She will frequently have panic attacks when she is in a large crowd or surrounded by people that make her uncomfortable.  She describes panic attacks as feeling like she is having a heart attack.  She states that her heart will start to race and she will feel like she cannot breathe.  She does have a family history of bipolar disorder in her mother and in her father however she denies any symptoms of mania.  She states that she will have periods of time where she has trouble sleeping due to her mind racing at night however it sounds more like anxiety than mania.  She certainly does not demonstrate pressured speech today or tangential thoughts.  She states that nearly every day she feels very little interest or pleasure in doing things.  Nearing every day she feels down depressed and hopeless.  On the remainder of the PHQ-9 depression screening she reports a 2 or 3 on sleeping, feeling tired, poor appetite, feeling bad about herself, trouble concentrating, psychomotor retardation, or thoughts of being better off dead.  However she denies any suicidal ideation. No past medical history on file. Past Surgical History:  Procedure Laterality Date  . EYE SURGERY Left 07/17/2014   Current Outpatient Medications on File Prior to Visit  Medication Sig Dispense Refill  . sertraline (ZOLOFT) 25 MG tablet Take 1 tablet daily x 2 weeks, then 2 tablets daily (Patient not taking: Reported  on 12/30/2020) 60 tablet 1   No current facility-administered medications on file prior to visit.   Allergies  Allergen Reactions  . Prozac [Fluoxetine]     Worsening mood/nausea    Social History   Socioeconomic History  . Marital status: Single    Spouse name: Not on file  . Number of children: Not on file  . Years of education: Not on file  . Highest education level: Not on file  Occupational History  . Not on file  Tobacco Use  . Smoking status: Passive Smoke Exposure - Never Smoker  . Smokeless tobacco: Never Used  Substance and Sexual Activity  . Alcohol use: No    Alcohol/week: 0.0 standard drinks  . Drug use: No  . Sexual activity: Never  Other Topics Concern  . Not on file  Social History Narrative  . Not on file   Social Determinants of Health   Financial Resource Strain: Not on file  Food Insecurity: Not on file  Transportation Needs: Not on file  Physical Activity: Not on file  Stress: Not on file  Social Connections: Not on file  Intimate Partner Violence: Not on file     Review of Systems  Psychiatric/Behavioral: Positive for depression.  All other systems reviewed and are negative.      Objective:   Physical Exam Vitals reviewed.  Cardiovascular:     Rate and Rhythm: Normal rate and regular rhythm.  Pulmonary:  Effort: Pulmonary effort is normal.     Breath sounds: Normal breath sounds.  Abdominal:     General: Abdomen is flat. Bowel sounds are normal. There is no distension.     Palpations: Abdomen is soft.     Tenderness: There is no abdominal tenderness.  Genitourinary:    Adnexa: Right adnexa normal and left adnexa normal.           Assessment & Plan:  GAD (generalized anxiety disorder)  Current moderate episode of major depressive disorder without prior episode (HCC)  We will start Lexapro 10 mg a day and use Xanax 0.5 mg every 8 hours as needed for panic attacks.  I would like to see the patient back in 4 to 6 weeks  for reassessment.  We discussed symptoms of mania and we will monitor for any symptoms of this after starting the Lexapro due to her family history.  Reassess in 6 weeks Symptoms appear to be consistent with bacterial vaginosis.  Begin Flagyl 500 mg p.o. twice daily for 7 days.  Consider Hylafem if symptoms become recurrent after menses.

## 2021-02-10 ENCOUNTER — Encounter: Payer: Self-pay | Admitting: Family Medicine

## 2021-02-10 ENCOUNTER — Other Ambulatory Visit: Payer: Self-pay

## 2021-02-10 ENCOUNTER — Ambulatory Visit (INDEPENDENT_AMBULATORY_CARE_PROVIDER_SITE_OTHER): Payer: Self-pay | Admitting: Family Medicine

## 2021-02-10 VITALS — BP 118/64 | HR 58 | Temp 98.2°F | Resp 14 | Ht 67.0 in | Wt 137.0 lb

## 2021-02-10 DIAGNOSIS — F411 Generalized anxiety disorder: Secondary | ICD-10-CM

## 2021-02-10 MED ORDER — ESCITALOPRAM OXALATE 10 MG PO TABS: 10.0000 mg | ORAL_TABLET | Freq: Every day | ORAL | 2 refills | Status: DC

## 2021-02-10 NOTE — Progress Notes (Signed)
Subjective:    Patient ID: Christina Conner, female    DOB: [birth year], 20 y.o.   MRN: [id number]  Anxiety    Depression        Past medical history includes anxiety.    12/30/20 Patient is a very pleasant 20 year old African-American female who presents today for depression and anxiety.  She states that she constantly feels anxious to leave her home.  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  She is able to work however often during the day she will have panic attacks.  She states that other than work, she feels unsafe leaving her home.  She is very scared of being in large crowds.  She will frequently have panic attacks when she is in a large crowd or surrounded by people that make her uncomfortable.  She describes panic attacks as feeling like she is having a heart attack.  She states that her heart will start to race and she will feel like she cannot breathe.  She does have a family history of bipolar disorder in her mother and in her father however she denies any symptoms of mania.  She states that she will have periods of time where she has trouble sleeping due to her mind racing at night however it sounds more like anxiety than mania.  She certainly does not demonstrate pressured speech today or tangential thoughts.  She states that nearly every day she feels very little interest or pleasure in doing things.  Nearing every day she feels down depressed and hopeless.  On the remainder of the PHQ-9 depression screening she reports a 2 or 3 on sleeping, feeling tired, poor appetite, feeling bad about herself, trouble concentrating, psychomotor retardation, or thoughts of being better off dead.  However she denies any suicidal ideation.  At that time, my plan was: We will start Lexapro 10 mg a day and use Xanax 0.5 mg every 8 hours as needed for panic attacks.  I would like to see the patient back in 4 to 6 weeks for reassessment.  We discussed symptoms of mania and we will  monitor for any symptoms of this after starting the Lexapro due to her family history.  Reassess in 6 weeks Symptoms appear to be consistent with bacterial vaginosis.  Begin Flagyl 500 mg p.o. twice daily for 7 days.  Consider Hylafem if symptoms become recurrent after menses.  02/10/21 Thankfully, the patient states that she is doing much better since starting the Lexapro.  She feels like her anxiety has improved dramatically.  She is not even having to use the Xanax.  She denies any depression today.  She denies any trouble with anhedonia.  She denies any trouble with insomnia.  She denies any psychomotor retardation or trouble concentrating.  She does still occasionally report feeling anxious or on edge but her total anxiety score on the GAD-7 is a 3 and she lists it as not difficult at all.  This is a dramatic improvement compared to before. History reviewed. No pertinent past medical history. Past Surgical History:  Procedure Laterality Date  . EYE SURGERY Left 07/17/2014   Current Outpatient Medications on File Prior to Visit  Medication Sig Dispense Refill  . ALPRAZolam (XANAX) 0.5 MG tablet Take 1 tablet (0.5 mg total) by mouth 3 (three) times daily as needed. 30 tablet 0   No current facility-administered medications on file prior to visit.   Allergies  Allergen Reactions  . Prozac [Fluoxetine]  Worsening mood/nausea    Social History   Socioeconomic History  . Marital status: Single    Spouse name: Not on file  . Number of children: Not on file  . Years of education: Not on file  . Highest education level: Not on file  Occupational History  . Not on file  Tobacco Use  . Smoking status: Passive Smoke Exposure - Never Smoker  . Smokeless tobacco: Never Used  Substance and Sexual Activity  . Alcohol use: No    Alcohol/week: 0.0 standard drinks  . Drug use: No  . Sexual activity: Never  Other Topics Concern  . Not on file  Social History Narrative  . Not on file    Social Determinants of Health   Financial Resource Strain: Not on file  Food Insecurity: Not on file  Transportation Needs: Not on file  Physical Activity: Not on file  Stress: Not on file  Social Connections: Not on file  Intimate Partner Violence: Not on file     Review of Systems  Psychiatric/Behavioral: Positive for depression.  All other systems reviewed and are negative.      Objective:   Physical Exam Vitals reviewed.  Cardiovascular:     Rate and Rhythm: Normal rate and regular rhythm.  Pulmonary:     Effort: Pulmonary effort is normal.     Breath sounds: Normal breath sounds.  Abdominal:     General: Abdomen is flat. Bowel sounds are normal. There is no distension.     Palpations: Abdomen is soft.     Tenderness: There is no abdominal tenderness.  Genitourinary:    Adnexa: Right adnexa normal and left adnexa normal.           Assessment & Plan:  GAD (generalized anxiety disorder)  I am very happy the patient is doing better.  Continue Lexapro 10 mg a day and then reassess in 6 months or sooner if worsening

## 2021-05-07 ENCOUNTER — Other Ambulatory Visit: Payer: Self-pay

## 2021-05-12 ENCOUNTER — Encounter: Payer: Self-pay | Admitting: Nurse Practitioner

## 2021-06-08 NOTE — Progress Notes (Signed)
BP 108/82   Pulse 71   Temp 98.3 F (36.8 C)   Ht 5\' 7"  (1.702 m)   Wt 138 lb 9.6 oz (62.9 kg)   LMP 05/22/2021   SpO2 95%   BMI 21.71 kg/m    Subjective:    Patient ID: 07/23/2021, female    DOB: 06/03/2001, 20 y.o.   MRN: [id number]  HPI: Christina Conner is a 20 y.o. female presenting on 06/09/2021 for comprehensive medical examination. Current medical complaints include:  VAGINAL DISCHARGE Reports frequent BV infections after period. Duration: weeks Discharge description: thin, slimey, fishy odor Pruritus: no Dysuria: no Malodorous: no Urinary frequency: no Fevers: no Abdominal pain: no  Sexual activity: yes; 1 female partner History of sexually transmitted diseases: yes; had chlamydia when she was 18  Recent antibiotic use: no Context: worse  Treatments attempted: boric acid suppositories  Also reports her mood has been exacerbated lately with stress of starting nursing school, wondering if Lexapro can be increased.  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  Has paperwork she needs to be filled out.  She currently lives with: parents; feels safe LMP: 05/22/2021 Currently sexually active with 1 female partner in closed relationship; does not use protection, participates in vaginal intercourse.    Depression Screen done today and results listed below:  Depression screen [facility] 2/9 06/09/2021 01/23/2020 08/30/2018 07/22/2017 05/03/2017  Decreased Interest 1 3 0 2 0  Down, Depressed, Hopeless 1 3 0 1 0  PHQ - 2 Score 2 6 0 3 0  Altered sleeping 1 3 - 3 0  Tired, decreased energy 2 3 - 3 0  Change in appetite 1 3 - 3 0  Feeling bad or failure about yourself  1 3 - 0 0  Trouble concentrating 3 3 - 3 0  Moving slowly or fidgety/restless 1 3 - 2 0  Suicidal thoughts 0 0 - 0 0  PHQ-9 Score 11 24 - 17 0  Difficult doing work/chores Very difficult Extremely dIfficult - Very difficult Not difficult at all    The patient does not have a history of falls. I did not complete a risk  assessment for falls. A plan of care for falls was not documented.   Past Medical History:  Past Medical History:  Diagnosis Date   Anxiety     Surgical History:  Past Surgical History:  Procedure Laterality Date   EYE SURGERY Left 07/17/2014    Medications:  Current Outpatient Medications on File Prior to Visit  Medication Sig   ALPRAZolam (XANAX) 0.5 MG tablet Take 1 tablet (0.5 mg total) by mouth 3 (three) times daily as needed.   No current facility-administered medications on file prior to visit.    Allergies:  Allergies  Allergen Reactions   Prozac [Fluoxetine]     Worsening mood/nausea     Social History:  Social History   Socioeconomic History   Marital status: Single    Spouse name: Not on file   Number of children: Not on file   Years of education: Not on file   Highest education level: Not on file  Occupational History   Not on file  Tobacco Use   Smoking status: Never    Passive exposure: Yes   Smokeless tobacco: Never  Substance and Sexual Activity   Alcohol use: No    Alcohol/week: 0.0 standard drinks   Drug use: No   Sexual activity: Never  Other Topics Concern   Not on file  Social History  Narrative   Not on file   Social Determinants of Health   Financial Resource Strain: Not on file  Food Insecurity: Not on file  Transportation Needs: Not on file  Physical Activity: Not on file  Stress: Not on file  Social Connections: Not on file  Intimate Partner Violence: Not on file   Social History   Tobacco Use  Smoking Status Never   Passive exposure: Yes  Smokeless Tobacco Never   Social History   Substance and Sexual Activity  Alcohol Use No   Alcohol/week: 0.0 standard drinks    Family History:  No family history on file.  Past medical history, surgical history, medications, allergies, family history and social history reviewed with patient today and changes made to appropriate areas of the chart.   Review of Systems   Constitutional: Negative.   HENT: Negative.    Eyes: Negative.   Respiratory: Negative.    Cardiovascular: Negative.   Gastrointestinal: Negative.   Genitourinary:        + vaginal discharge  Musculoskeletal: Negative.   Skin: Negative.   Neurological: Negative.   Psychiatric/Behavioral:  The patient is nervous/anxious.       Objective:    BP 108/82   Pulse 71   Temp 98.3 F (36.8 C)   Ht 5\' 7"  (1.702 m)   Wt 138 lb 9.6 oz (62.9 kg)   LMP 05/22/2021   SpO2 95%   BMI 21.71 kg/m   Wt Readings from Last 3 Encounters:  06/09/21 138 lb 9.6 oz (62.9 kg)  02/10/21 137 lb (62.1 kg)  12/30/20 143 lb (64.9 kg)    Physical Exam Vitals and nursing note reviewed. Exam conducted with a chaperone present (AW).  Constitutional:      General: She is not in acute distress.    Appearance: Normal appearance. She is normal weight. She is not ill-appearing or toxic-appearing.  HENT:     Head: Normocephalic and atraumatic.     Right Ear: Tympanic membrane, ear canal and external ear normal.     Left Ear: Tympanic membrane, ear canal and external ear normal.     Nose: Nose normal. No congestion or rhinorrhea.     Mouth/Throat:     Mouth: Mucous membranes are moist.     Pharynx: Oropharynx is clear. No oropharyngeal exudate.  Eyes:     General: No scleral icterus.    Extraocular Movements: Extraocular movements intact.     Pupils: Pupils are equal, round, and reactive to light.  Cardiovascular:     Rate and Rhythm: Normal rate and regular rhythm.     Pulses: Normal pulses.     Heart sounds: Normal heart sounds. No murmur heard. Pulmonary:     Effort: Pulmonary effort is normal. No respiratory distress.     Breath sounds: No wheezing or rhonchi.  Chest:  Breasts:    Right: Normal. No inverted nipple, mass, nipple discharge or skin change.     Left: Normal. No inverted nipple, mass, nipple discharge or skin change.  Abdominal:     General: Abdomen is flat. Bowel sounds are normal.  There is no distension.     Palpations: Abdomen is soft.     Tenderness: There is no abdominal tenderness.  Genitourinary:    Exam position: Lithotomy position.     Pubic Area: No rash.      Labia:        Right: No tenderness or lesion.        Left: No tenderness  or lesion.      Vagina: Vaginal discharge present.     Cervix: No cervical motion tenderness.     Uterus: Normal.      Adnexa: Right adnexa normal and left adnexa normal.     Comments: Thin, white, nonodorous discharge Musculoskeletal:        General: No swelling or tenderness. Normal range of motion.     Cervical back: Normal range of motion and neck supple. No rigidity or tenderness.     Right lower leg: No edema.     Left lower leg: No edema.  Lymphadenopathy:     Lower Body: No right inguinal adenopathy. No left inguinal adenopathy.  Skin:    General: Skin is warm and dry.     Capillary Refill: Capillary refill takes less than 2 seconds.     Coloration: Skin is not jaundiced or pale.  Neurological:     General: No focal deficit present.     Mental Status: She is alert and oriented to person, place, and time.     Motor: No weakness.     Gait: Gait normal.  Psychiatric:        Mood and Affect: Mood normal.        Behavior: Behavior normal.        Thought Content: Thought content normal.        Judgment: Judgment normal.      Assessment & Plan:   Problem List Items Addressed This Visit       Other   MDD (major depressive disorder)    Chronic, exacerbated.  PHQ-9 increased today, although improved from last check.  No SI/HI.  Will increase Lexapro to 20 mg daily.  Follow up if this does not help with mood.        Relevant Medications   escitalopram (LEXAPRO) 20 MG tablet   GAD (generalized anxiety disorder)    Chronic, exacerbated.  PHQ-9 increased today, although improved from last check.  No SI/HI.  Will increase Lexapro to 20 mg daily.  Follow up if this does not help with mood.        Relevant  Medications   escitalopram (LEXAPRO) 20 MG tablet   Other Visit Diagnoses     Annual physical exam    -  Primary   Bacterial vaginosis       Relevant Medications   metroNIDAZOLE (FLAGYL) 500 MG tablet   Other Relevant Orders   WET PREP FOR TRICH, YEAST, CLUE (Completed)   C. trachomatis/N. gonorrhoeae RNA (Completed)   Screening-pulmonary TB       Relevant Orders   QuantiFERON-TB Gold Plus (Completed)   Need for Td vaccine       Relevant Orders   Tdap vaccine greater than or equal to 7yo IM (Completed)   Need for HPV vaccination       Relevant Orders   HPV 9-valent vaccine,Recombinat (Completed)   Immunity status testing       Relevant Orders   Varicella zoster antibody, IgG (Completed)   Measles/Mumps/Rubella Immunity (Completed)        Follow up plan: Return in about 6 months (around 12/10/2021), or if symptoms worsen or fail to improve.   LABORATORY TESTING:  - Pap smear: up to date  IMMUNIZATIONS:   - Tdap: Tetanus vaccination status reviewed: td booster given today. - Influenza: Postponed to flu season - Pneumovax: Not applicable - Prevnar: Not applicable - HPV: Administered today - Zostavax vaccine: Not applicable - COVID-19 vaccine: has  received 2 doses of Moderna with booster in April - will be due in October for 2nd booster  SCREENING: -Mammogram: Not applicable  - Colonoscopy: Not applicable  - Bone Density: Not applicable  -Hearing Test: Not applicable  -Spirometry: Not applicable   PATIENT COUNSELING:   Advised to take 1 mg of folate supplement per day if capable of pregnancy.   Sexuality: Discussed sexually transmitted diseases, partner selection, use of condoms, avoidance of unintended pregnancy  and contraceptive alternatives.   Advised to avoid cigarette smoking.  I discussed with the patient that most people either abstain from alcohol or drink within safe limits (<=14/week and <=4 drinks/occasion for males, <=7/weeks and <= 3 drinks/occasion  for females) and that the risk for alcohol disorders and other health effects rises proportionally with the number of drinks per week and how often a drinker exceeds daily limits.  Discussed cessation/primary prevention of drug use and availability of treatment for abuse.   Diet: Encouraged to adjust caloric intake to maintain  or achieve ideal body weight, to reduce intake of dietary saturated fat and total fat, to limit sodium intake by avoiding high sodium foods and not adding table salt, and to maintain adequate dietary potassium and calcium preferably from fresh fruits, vegetables, and low-fat dairy products.    stressed the importance of regular exercise  Injury prevention: Discussed safety belts, safety helmets, smoke detector, smoking near bedding or upholstery.   Dental health: Discussed importance of regular tooth brushing, flossing, and dental visits.    NEXT PREVENTATIVE PHYSICAL DUE IN 1 YEAR. Return in about 6 months (around 12/10/2021), or if symptoms worsen or fail to improve.

## 2021-06-09 ENCOUNTER — Encounter: Payer: Self-pay | Admitting: Nurse Practitioner

## 2021-06-09 ENCOUNTER — Telehealth: Payer: Self-pay

## 2021-06-09 ENCOUNTER — Ambulatory Visit (INDEPENDENT_AMBULATORY_CARE_PROVIDER_SITE_OTHER): Payer: Self-pay | Admitting: Nurse Practitioner

## 2021-06-09 ENCOUNTER — Other Ambulatory Visit: Payer: Self-pay

## 2021-06-09 VITALS — BP 108/82 | HR 71 | Temp 98.3°F | Ht 67.0 in | Wt 138.6 lb

## 2021-06-09 DIAGNOSIS — F411 Generalized anxiety disorder: Secondary | ICD-10-CM

## 2021-06-09 DIAGNOSIS — Z0000 Encounter for general adult medical examination without abnormal findings: Secondary | ICD-10-CM

## 2021-06-09 DIAGNOSIS — Z0184 Encounter for antibody response examination: Secondary | ICD-10-CM

## 2021-06-09 DIAGNOSIS — Z23 Encounter for immunization: Secondary | ICD-10-CM

## 2021-06-09 DIAGNOSIS — F321 Major depressive disorder, single episode, moderate: Secondary | ICD-10-CM

## 2021-06-09 DIAGNOSIS — Z0001 Encounter for general adult medical examination with abnormal findings: Secondary | ICD-10-CM

## 2021-06-09 DIAGNOSIS — B9689 Other specified bacterial agents as the cause of diseases classified elsewhere: Secondary | ICD-10-CM

## 2021-06-09 DIAGNOSIS — N760 Acute vaginitis: Secondary | ICD-10-CM

## 2021-06-09 DIAGNOSIS — Z111 Encounter for screening for respiratory tuberculosis: Secondary | ICD-10-CM

## 2021-06-09 DIAGNOSIS — N898 Other specified noninflammatory disorders of vagina: Secondary | ICD-10-CM

## 2021-06-09 LAB — WET PREP FOR TRICH, YEAST, CLUE

## 2021-06-09 MED ORDER — ESCITALOPRAM OXALATE 20 MG PO TABS: 20.0000 mg | ORAL_TABLET | Freq: Every day | ORAL | 1 refills | Status: DC

## 2021-06-09 MED ORDER — METRONIDAZOLE 0.75 % VA GEL: 1.0000 | Freq: Every day | VAGINAL | 0 refills | Status: DC

## 2021-06-10 LAB — MEASLES/MUMPS/RUBELLA IMMUNITY
Mumps IgG: 75.6 AU/mL
Rubella: 9.02 Index
Rubeola IgG: 165 AU/mL

## 2021-06-10 LAB — C. TRACHOMATIS/N. GONORRHOEAE RNA
C. trachomatis RNA, TMA: NOT DETECTED
N. gonorrhoeae RNA, TMA: NOT DETECTED

## 2021-06-10 LAB — VARICELLA ZOSTER ANTIBODY, IGG: Varicella IgG: 981.2 index

## 2021-06-10 MED ORDER — CLINDAMYCIN PHOSPHATE 2 % VA CREA: 1.0000 | TOPICAL_CREAM | Freq: Every day | VAGINAL | 0 refills | Status: DC

## 2021-06-10 NOTE — Telephone Encounter (Signed)
Cleocin cream sent in. If this is too expensive, pt instructed to call bk for alternative option

## 2021-06-11 MED ORDER — METRONIDAZOLE 500 MG PO TABS: 500.0000 mg | ORAL_TABLET | Freq: Two times a day (BID) | ORAL | 0 refills | Status: AC

## 2021-06-11 NOTE — Telephone Encounter (Signed)
Called pt, lm vm and sent mychart message

## 2021-06-12 ENCOUNTER — Telehealth: Payer: Self-pay

## 2021-06-12 LAB — QUANTIFERON-TB GOLD PLUS
Mitogen-NIL: >10 IU/mL
NIL: 0.02 IU/mL
QuantiFERON-TB Gold Plus: NEGATIVE
TB1-NIL: 0 IU/mL
TB2-NIL: 0 IU/mL

## 2021-06-12 NOTE — Assessment & Plan Note (Addendum)
Chronic, exacerbated.  PHQ-9 increased today, although improved from last check.  No SI/HI.  Will increase Lexapro to 20 mg daily.  Follow up if this does not help with mood.

## 2021-06-12 NOTE — Assessment & Plan Note (Addendum)
Chronic, exacerbated.  PHQ-9 increased today, although improved from last check.  No SI/HI.  Will increase Lexapro to 20 mg daily.  Follow up if this does not help with mood.  

## 2021-06-12 NOTE — Telephone Encounter (Signed)
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 Forms are located at front office. Rolly Salter will be coming to pick up for pt

## 2021-09-26 ENCOUNTER — Ambulatory Visit (INDEPENDENT_AMBULATORY_CARE_PROVIDER_SITE_OTHER): Payer: Self-pay | Admitting: Nurse Practitioner

## 2021-09-26 ENCOUNTER — Other Ambulatory Visit: Payer: Self-pay

## 2021-09-26 ENCOUNTER — Encounter: Payer: Self-pay | Admitting: Nurse Practitioner

## 2021-09-26 VITALS — BP 102/78 | HR 78 | Temp 98.2°F | Resp 18 | Ht 67.0 in | Wt 138.0 lb

## 2021-09-26 DIAGNOSIS — B9689 Other specified bacterial agents as the cause of diseases classified elsewhere: Secondary | ICD-10-CM

## 2021-09-26 DIAGNOSIS — N760 Acute vaginitis: Secondary | ICD-10-CM

## 2021-09-26 DIAGNOSIS — Z23 Encounter for immunization: Secondary | ICD-10-CM

## 2021-09-26 LAB — WET PREP FOR TRICH, YEAST, CLUE

## 2021-09-26 MED ORDER — METRONIDAZOLE 500 MG PO TABS: 500.0000 mg | ORAL_TABLET | Freq: Two times a day (BID) | ORAL | 0 refills | Status: AC

## 2021-09-26 NOTE — Progress Notes (Signed)
Subjective:    Patient ID: Christina Conner, female    DOB: [birth year], 20 y.o.   MRN: [id number]  HPI: Christina Conner is a 20 y.o. female presenting for vaginal odor.  Chief Complaint  Patient presents with   vaginal odor    For several weeks, thinks has BV, not concerned about STI, denies discharge/irritation   VAGINAL ODOR Patient has a history of BV - last treated in July 2022 with oral Flagyl.  We tried sending gels in but they were too expensive.  She reports the odor came back 2 weeks ago and she tried boric acid suppository without relief. Duration: weeks Discharge description: no discharge  Pruritus: no Dysuria: no Malodorous: yes Urinary frequency: no Fevers: no Abdominal pain: no  Sexual activity: monogamous; female History of sexually transmitted diseases: no Recent antibiotic use: no Context: worse  Treatments attempted: Boric acid suppositories  Allergies  Allergen Reactions   Prozac [Fluoxetine]     Worsening mood/nausea     Outpatient Encounter Medications as of 09/26/2021  Medication Sig   ALPRAZolam (XANAX) 0.5 MG tablet Take 1 tablet (0.5 mg total) by mouth 3 (three) times daily as needed.   escitalopram (LEXAPRO) 20 MG tablet Take 1 tablet (20 mg total) by mouth daily.   metroNIDAZOLE (FLAGYL) 500 MG tablet Take 1 tablet (500 mg total) by mouth 2 (two) times daily for 7 days.   No facility-administered encounter medications on file as of 09/26/2021.    Patient Active Problem List   Diagnosis Date Noted   MDD (major depressive disorder) 01/23/2020   GAD (generalized anxiety disorder) 01/23/2020   Tinea versicolor 06/09/2016   Contraception management 12/18/2013   Chronic constipation 12/18/2013   Other malaise and fatigue 04/19/2013   Recurrent upper abdominal pain 04/19/2013   Dizziness and giddiness 04/19/2013    Past Medical History:  Diagnosis Date   Anxiety     Relevant past medical, surgical, family and social history reviewed and  updated as indicated. Interim medical history since our last visit reviewed.  Review of Systems Per HPI unless specifically indicated above     Objective:    BP 102/78   Pulse 78   Temp 98.2 F (36.8 C) (Temporal)   Resp 18   Ht 5\' 7"  (1.702 m)   Wt 138 lb (62.6 kg)   SpO2 97%   BMI 21.61 kg/m   Wt Readings from Last 3 Encounters:  09/26/21 138 lb (62.6 kg)  06/09/21 138 lb 9.6 oz (62.9 kg)  02/10/21 137 lb (62.1 kg)    Physical Exam Vitals and nursing note reviewed. Exam conducted with a chaperone present [facility]).  Constitutional:      General: She is not in acute distress.    Appearance: Normal appearance. She is not toxic-appearing.  Genitourinary:    General: Normal vulva.     Exam position: Lithotomy position.     Pubic Area: No rash.      Labia:        Right: No rash, tenderness or lesion.        Left: No rash, tenderness or lesion.      Vagina: Vaginal discharge present.     Comments: Small amount white discharge present, no odor appreciated Lymphadenopathy:     Lower Body: No right inguinal adenopathy. No left inguinal adenopathy.  Skin:    General: Skin is warm and dry.     Coloration: Skin is not jaundiced or pale.     Findings:  No erythema.  Neurological:     Mental Status: She is alert and oriented to person, place, and time.  Psychiatric:        Mood and Affect: Mood normal.        Behavior: Behavior normal.        Thought Content: Thought content normal.        Judgment: Judgment normal.      Assessment & Plan:   Problem List Items Addressed This Visit   None Visit Diagnoses     Bacterial vaginosis    -  Primary   Wet prep today + clue cells.  Treat with oral Flagyl.  Follow up if symptoms do not improve after treatment.    Relevant Medications   metroNIDAZOLE (FLAGYL) 500 MG tablet   Other Relevant Orders   WET PREP FOR TRICH, YEAST, CLUE (Completed)   Need for HPV vaccination       Relevant Orders   HPV 9-valent vaccine,Recombinat  (Completed)        Follow up plan: No follow-ups on file.

## 2021-12-17 ENCOUNTER — Other Ambulatory Visit: Payer: Self-pay | Admitting: Nurse Practitioner

## 2021-12-17 DIAGNOSIS — F411 Generalized anxiety disorder: Secondary | ICD-10-CM

## 2021-12-17 DIAGNOSIS — F321 Major depressive disorder, single episode, moderate: Secondary | ICD-10-CM

## 2021-12-17 NOTE — Telephone Encounter (Signed)
Pt called in to inquire about this refill of escitalopram (LEXAPRO) 20 MG. Pt states that pharmacy explained to her that she has no more refills of this med. Please advise.  Cb#: [phone]

## 2022-02-16 ENCOUNTER — Encounter: Payer: Self-pay | Admitting: Nurse Practitioner

## 2022-02-16 ENCOUNTER — Ambulatory Visit (INDEPENDENT_AMBULATORY_CARE_PROVIDER_SITE_OTHER): Payer: Self-pay | Admitting: Nurse Practitioner

## 2022-02-16 VITALS — BP 125/77 | HR 63 | Temp 98.4°F | Ht 67.0 in | Wt 160.4 lb

## 2022-02-16 DIAGNOSIS — F321 Major depressive disorder, single episode, moderate: Secondary | ICD-10-CM

## 2022-02-16 DIAGNOSIS — F411 Generalized anxiety disorder: Secondary | ICD-10-CM

## 2022-02-16 DIAGNOSIS — Z7689 Persons encountering health services in other specified circumstances: Secondary | ICD-10-CM

## 2022-02-16 MED ORDER — BUPROPION HCL ER (XL) 150 MG PO TB24: 150.0000 mg | ORAL_TABLET | Freq: Every day | ORAL | 0 refills | Status: DC

## 2022-02-16 MED ORDER — ESCITALOPRAM OXALATE 20 MG PO TABS: 20.0000 mg | ORAL_TABLET | Freq: Every day | ORAL | 1 refills | Status: DC

## 2022-02-16 NOTE — Progress Notes (Addendum)
? ?  Subjective:  ? ? Patient ID: Christina Conner, female    DOB: [birth year], 21 y.o.   MRN: [id number] ? ?HPI ? ?Pt here to establish care with provider.  Pt would like refills on Lexapro; would like to increase dose if possible. Patient states that she has noticed that she has decreased concentration while she has been in school.  Patient also states that sometimes she has trouble thinking because of her anxiety.  Patient is in nursing school. Pt takes Xanax rarely and does not need refill at this time.  ? ?Patient has no other complaints. ? ? ?Review of Systems  ?Psychiatric/Behavioral:  Positive for decreased concentration.   ?All other systems reviewed and are negative. ? ?   ?Objective:  ? Physical Exam ?Vitals reviewed.  ?Constitutional:   ?   General: She is not in acute distress. ?   Appearance: Normal appearance. She is normal weight. She is not ill-appearing, toxic-appearing or diaphoretic.  ?Cardiovascular:  ?   Rate and Rhythm: Normal rate and regular rhythm.  ?   Pulses: Normal pulses.  ?   Heart sounds: Normal heart sounds. No murmur heard. ?Pulmonary:  ?   Effort: Pulmonary effort is normal. No respiratory distress.  ?   Breath sounds: Normal breath sounds. No wheezing.  ?Musculoskeletal:  ?   Comments: Grossly intact  ?Skin: ?   General: Skin is warm.  ?   Capillary Refill: Capillary refill takes less than 2 seconds.  ?Neurological:  ?   Mental Status: She is alert.  ?   Comments: Grossly intact  ?Psychiatric:     ?   Mood and Affect: Mood normal.     ?   Behavior: Behavior normal.  ? ? ?   ?Assessment & Plan:  ? ?1. Encounter to establish care ?-Return to clinic 4 weeks ? ?2. GAD (generalized anxiety disorder) ?-GAD-7 score is 9 today. ?-Lexapro maxed out at 20 mg a day. ?-We will add on Wellbutrin to help with concentration. ?-If symptoms not better will Wellbutrin we will try BuSpar as symptoms may be related more to anxiety. ?-Side effects of Wellbutrin discussed in detail. ?- buPROPion (WELLBUTRIN  XL) 150 MG 24 hr tablet; Take 1 tablet (150 mg total) by mouth daily.  Dispense: 60 tablet; Refill: 0 ?- escitalopram (LEXAPRO) 20 MG tablet; Take 1 tablet (20 mg total) by mouth daily.  Dispense: 90 tablet; Refill: 1 ?-Return to clinic in 4 weeks for evaluation of effectiveness of Wellbutrin.  If working well we will continue with Wellbutrin. ? ?3. Current moderate episode of major depressive disorder without prior episode (HCC) ?-PHQ-9 14 today. ?- buPROPion (WELLBUTRIN XL) 150 MG 24 hr tablet; Take 1 tablet (150 mg total) by mouth daily.  Dispense: 60 tablet; Refill: 0 ?- escitalopram (LEXAPRO) 20 MG tablet; Take 1 tablet (20 mg total) by mouth daily.  Dispense: 90 tablet; Refill: 1 ?-Return to clinic 4 weeks ? ?  ?Note:  This document was prepared using Dragon voice recognition software and may include unintentional dictation errors. ? ?

## 2022-03-04 ENCOUNTER — Ambulatory Visit (INDEPENDENT_AMBULATORY_CARE_PROVIDER_SITE_OTHER): Payer: Self-pay | Admitting: Nurse Practitioner

## 2022-03-04 ENCOUNTER — Encounter: Payer: Self-pay | Admitting: Nurse Practitioner

## 2022-03-04 VITALS — BP 108/72 | HR 86 | Temp 98.1°F | Ht 67.0 in | Wt 155.0 lb

## 2022-03-04 DIAGNOSIS — N898 Other specified noninflammatory disorders of vagina: Secondary | ICD-10-CM

## 2022-03-04 DIAGNOSIS — F321 Major depressive disorder, single episode, moderate: Secondary | ICD-10-CM

## 2022-03-04 LAB — POCT WET PREP WITH KOH
KOH Prep POC: POSITIVE — AB
Trichomonas, UA: NEGATIVE

## 2022-03-04 MED ORDER — METRONIDAZOLE 500 MG PO TABS: 500.0000 mg | ORAL_TABLET | Freq: Two times a day (BID) | ORAL | 0 refills | Status: DC

## 2022-03-04 MED ORDER — FLUCONAZOLE 150 MG PO TABS: 150.0000 mg | ORAL_TABLET | Freq: Every day | ORAL | 0 refills | Status: DC

## 2022-03-04 MED ORDER — BUPROPION HCL ER (XL) 150 MG PO TB24: 150.0000 mg | ORAL_TABLET | Freq: Every day | ORAL | 0 refills | Status: DC

## 2022-03-04 NOTE — Progress Notes (Signed)
? ?Subjective:  ? ? Patient ID: Christina Conner, female    DOB: [birth year], 21 y.o.   MRN: [id number] ? ?HPI ? ?21 year old female patient with medical history of anxiety, depression, recurrent BV presents to the clinic today with concerns of vaginal odor and discharge for about a week.  Patient states that she just finished her menstrual cycle.  About a week ago and noticed the symptoms after her menstrual cycle.  Patient states that the discharge looks grayish in color and states that the discharge is a lot.  Patient describes older as a really bad smell.  Patient denies any burning or itchiness. ? ?Patient was started on Wellbutrin 150 mg during last visit for possible ADHD and depressive symptoms.  Patient states that she has been doing really well on Wellbutrin.  Patient would like to continue with Wellbutrin. ? ?Review of Systems  ?Genitourinary:   ?     Vaginal odor and discharge  ?All other systems reviewed and are negative. ? ?   ?Objective:  ? Physical Exam ?Vitals reviewed. Exam conducted with a chaperone present.  ?Constitutional:   ?   General: She is not in acute distress. ?   Appearance: Normal appearance. She is normal weight. She is not ill-appearing, toxic-appearing or diaphoretic.  ?HENT:  ?   Head: Normocephalic and atraumatic.  ?Abdominal:  ?   Hernia: There is no hernia in the left inguinal area or right inguinal area.  ?Genitourinary: ?   Exam position: Lithotomy position.  ?   Pubic Area: No rash or pubic lice.   ?   Labia:     ?   Right: No rash, tenderness or lesion.     ?   Left: No rash, tenderness, lesion or injury.   ?   Vagina: No signs of injury and foreign body. Vaginal discharge present. No erythema, tenderness, bleeding, lesions or prolapsed vaginal walls.  ?   Cervix: Erythema present. No cervical motion tenderness, discharge, friability, lesion, cervical bleeding or eversion.  ?   Uterus: Normal. Not deviated, not enlarged, not fixed, not tender and no uterine prolapse.   ?    Adnexa: Right adnexa normal and left adnexa normal.  ?   Comments: Moderate amount of milky white to grayish discharge present ? ?Positive amines noted ?Musculoskeletal:  ?   Comments: Grossly intact  ?Lymphadenopathy:  ?   Lower Body: No right inguinal adenopathy. No left inguinal adenopathy.  ?Neurological:  ?   Mental Status: She is alert.  ?   Comments: Grossly intact  ?Psychiatric:     ?   Mood and Affect: Mood normal.     ?   Behavior: Behavior normal.  ? ? ? ?   ?Assessment & Plan:  ? ?1. Vaginal odor ?-BV versus desquamative inflammatory vaginitis ?-Wet prep showed pH of 6.0, a few clue cells, and positive amines which makes me suspect BV ?-However also consider disarming of inflammatory vaginitis.  If symptoms are not better after treatment of metronidazole for 14 days (with Diflucan for following yeast infection) will consider treatment with CLINDAMYCIN GEL If patient is agreeable. ?- Will also rule STDs ?- Chlamydia/Gonococcus/Trichomonas, NAA ?- metroNIDAZOLE (FLAGYL) 500 MG tablet; Take 1 tablet (500 mg total) by mouth 2 (two) times daily with a meal for 14 days.  Dispense: 28 tablet; Refill: 0 ?- fluconazole (DIFLUCAN) 150 MG tablet; Take 1 tablet (150 mg total) by mouth daily. Take 1 pill after finishing antibiotics. If still symptomatic you can wait  3 days and then take another pill.  Dispense: 2 tablet; Refill: 0 ?- POCT Wet Prep with KOH ?-Return to clinic if symptoms are not better or worse ? ?2. Current moderate episode of major depressive disorder without prior episode (HCC) ?-PHQ-9 of 6 today ?-GAD 7 score of 5 today ?-Patient doing a lot better emotionally ?-We will continue with bupropion as prescribed ?- buPROPion (WELLBUTRIN XL) 150 MG 24 hr tablet; Take 1 tablet (150 mg total) by mouth daily.  Dispense: 60 tablet; Refill: 0 ?-Return to clinic as needed ? ?  ?Note:  This document was prepared using Dragon voice recognition software and may include unintentional dictation errors. ? ?

## 2022-03-06 LAB — SPECIMEN STATUS REPORT

## 2022-03-06 LAB — CHLAMYDIA/GONOCOCCUS/TRICHOMONAS, NAA
Chlamydia by NAA: NEGATIVE
Gonococcus by NAA: NEGATIVE
Trich vag by NAA: NEGATIVE

## 2022-03-17 ENCOUNTER — Ambulatory Visit: Payer: Self-pay | Admitting: Nurse Practitioner

## 2022-05-24 ENCOUNTER — Other Ambulatory Visit: Payer: Self-pay | Admitting: Nurse Practitioner

## 2022-05-24 DIAGNOSIS — F321 Major depressive disorder, single episode, moderate: Secondary | ICD-10-CM

## 2022-09-07 ENCOUNTER — Other Ambulatory Visit: Payer: Self-pay | Admitting: Nurse Practitioner

## 2022-09-07 DIAGNOSIS — F321 Major depressive disorder, single episode, moderate: Secondary | ICD-10-CM

## 2022-09-28 ENCOUNTER — Ambulatory Visit (INDEPENDENT_AMBULATORY_CARE_PROVIDER_SITE_OTHER): Payer: Self-pay | Admitting: Nurse Practitioner

## 2022-09-28 DIAGNOSIS — F321 Major depressive disorder, single episode, moderate: Secondary | ICD-10-CM

## 2022-09-28 DIAGNOSIS — F411 Generalized anxiety disorder: Secondary | ICD-10-CM

## 2022-09-28 DIAGNOSIS — N898 Other specified noninflammatory disorders of vagina: Secondary | ICD-10-CM

## 2022-09-28 MED ORDER — BUPROPION HCL ER (XL) 150 MG PO TB24: 150.0000 mg | ORAL_TABLET | Freq: Every day | ORAL | 0 refills | Status: DC

## 2022-09-28 MED ORDER — FLUCONAZOLE 150 MG PO TABS: 150.0000 mg | ORAL_TABLET | Freq: Every day | ORAL | 0 refills | Status: DC

## 2022-09-28 MED ORDER — BUPROPION HCL ER (XL) 150 MG PO TB24: 150.0000 mg | ORAL_TABLET | Freq: Every day | ORAL | 1 refills | Status: DC

## 2022-09-28 MED ORDER — METRONIDAZOLE 500 MG PO TABS: 500.0000 mg | ORAL_TABLET | Freq: Two times a day (BID) | ORAL | 0 refills | Status: AC

## 2022-09-28 MED ORDER — ESCITALOPRAM OXALATE 20 MG PO TABS: 20.0000 mg | ORAL_TABLET | Freq: Every day | ORAL | 1 refills | Status: DC

## 2022-09-28 NOTE — Progress Notes (Unsigned)
   Subjective:    Patient ID: Christina Conner, female    DOB: Dec 16, 2000, 21 y.o.   MRN: [id number]  HPI Patient arrived states she believes she has bacteria vaginitis, she denies itching, she states she has and fishy odor that does not go away. Patient denies discharge.   Review of Systems     Objective:   Physical Exam        Assessment & Plan:

## 2022-09-30 ENCOUNTER — Encounter: Payer: Self-pay | Admitting: Nurse Practitioner

## 2023-07-14 ENCOUNTER — Ambulatory Visit: Payer: Self-pay | Admitting: Family Medicine

## 2023-07-15 ENCOUNTER — Encounter: Payer: Self-pay | Admitting: Family Medicine

## 2023-07-15 ENCOUNTER — Ambulatory Visit (INDEPENDENT_AMBULATORY_CARE_PROVIDER_SITE_OTHER): Payer: 59 | Admitting: Family Medicine

## 2023-07-15 VITALS — BP 120/74 | HR 72 | Temp 98.2°F | Ht 67.0 in | Wt 198.1 lb

## 2023-07-15 DIAGNOSIS — F321 Major depressive disorder, single episode, moderate: Secondary | ICD-10-CM

## 2023-07-15 DIAGNOSIS — E66811 Obesity, class 1: Secondary | ICD-10-CM

## 2023-07-15 DIAGNOSIS — R8290 Unspecified abnormal findings in urine: Secondary | ICD-10-CM | POA: Diagnosis not present

## 2023-07-15 DIAGNOSIS — N898 Other specified noninflammatory disorders of vagina: Secondary | ICD-10-CM | POA: Diagnosis not present

## 2023-07-15 DIAGNOSIS — N760 Acute vaginitis: Secondary | ICD-10-CM | POA: Insufficient documentation

## 2023-07-15 DIAGNOSIS — Z13220 Encounter for screening for lipoid disorders: Secondary | ICD-10-CM | POA: Diagnosis not present

## 2023-07-15 DIAGNOSIS — B9689 Other specified bacterial agents as the cause of diseases classified elsewhere: Secondary | ICD-10-CM

## 2023-07-15 DIAGNOSIS — E669 Obesity, unspecified: Secondary | ICD-10-CM

## 2023-07-15 LAB — URINALYSIS, ROUTINE W REFLEX MICROSCOPIC
Bilirubin Urine: NEGATIVE
Glucose, UA: NEGATIVE
Hgb urine dipstick: NEGATIVE
Hyaline Cast: NONE SEEN /LPF
Ketones, ur: NEGATIVE
Leukocytes,Ua: NEGATIVE
Nitrite: NEGATIVE
Specific Gravity, Urine: 1.02 (ref 1.001–1.035)
WBC, UA: NONE SEEN /HPF (ref 0–5)
pH: 7.5 (ref 5.0–8.0)

## 2023-07-15 LAB — WET PREP FOR TRICH, YEAST, CLUE

## 2023-07-15 LAB — MICROSCOPIC MESSAGE

## 2023-07-15 MED ORDER — BUPROPION HCL ER (XL) 300 MG PO TB24: 300.0000 mg | ORAL_TABLET | Freq: Every day | ORAL | 1 refills | Status: DC

## 2023-07-15 MED ORDER — METRONIDAZOLE 500 MG PO TABS: 500.0000 mg | ORAL_TABLET | Freq: Two times a day (BID) | ORAL | 0 refills | Status: AC

## 2023-07-15 NOTE — Assessment & Plan Note (Signed)
Wet prep positive for clue cells and bacteria in presence of malodorous discharge. Will treat with Flagyl 500mg  BID for 7 days. Counseled on importance of completing full course of antibiotics.

## 2023-07-15 NOTE — Patient Instructions (Signed)
It was great to meet you today and I'm excited to have you join the [facility] practice. I hope you had a positive experience today! If you feel so inclined, please feel free to recommend our practice to friends and family. Mila Merry, FNP-C

## 2023-07-15 NOTE — Assessment & Plan Note (Signed)
Uncontrolled. Continue Lexapro 20mg  daily and increase Wellbutrin XL to 300mg  daily. Denies SI/HI. Encouraged to reestablish care with a therapist. Follow up in 2 weeks.

## 2023-07-15 NOTE — Progress Notes (Signed)
New Patient Office Visit  Subjective    Patient ID: Christina Conner, female    DOB: [birth year]  Age: 22 y.o. MRN: [id number]  CC:  Chief Complaint  Patient presents with   Establish Care    Per pt thinks she pos have a UTI    HPI [name] presents to establish care. Oriented to practice routines and expectations. She has been seeing PCP regularly.  PMH of recurrent BV and anxiety. Concerns include malodorous urine and vaginal discharge as well as yellow discharge. She denies pain or dysuria, abdominal pain, abnormal uterine bleeding. She does report a history of recurrent BV but admits she does not usually complete her course of antibiotics due to lack of insurance and cost. She also reports uncontrolled depression and anxiety on Lexapro 20mg  daily and Wellbutrin XL 150mg  daily.   Cervical CA screening: approximate date 03/19/2020 and was normal Tobacco: vapes STI: declines Vaccines:  UTD    Outpatient Encounter Medications as of 07/15/2023  Medication Sig   ALPRAZolam (XANAX) 0.5 MG tablet Take 1 tablet (0.5 mg total) by mouth 3 (three) times daily as needed.   escitalopram (LEXAPRO) 20 MG tablet Take 1 tablet (20 mg total) by mouth daily.   metroNIDAZOLE (FLAGYL) 500 MG tablet Take 1 tablet (500 mg total) by mouth 2 (two) times daily for 7 days.   [DISCONTINUED] buPROPion (WELLBUTRIN XL) 150 MG 24 hr tablet Take 1 tablet (150 mg total) by mouth daily.   buPROPion (WELLBUTRIN XL) 300 MG 24 hr tablet Take 1 tablet (300 mg total) by mouth daily.   [DISCONTINUED] fluconazole (DIFLUCAN) 150 MG tablet Take 1 tablet (150 mg total) by mouth daily. Take 1 pill after finishing antibiotics. If still symptomatic you can wait 3 days and then take another pill. (Patient not taking: Reported on 07/15/2023)   No facility-administered encounter medications on file as of 07/15/2023.    Past Medical History:  Diagnosis Date   Anxiety     Past Surgical History:  Procedure Laterality Date    EYE SURGERY Left 07/17/2014    History reviewed. No pertinent family history.  Social History   Socioeconomic History   Marital status: Single    Spouse name: Not on file   Number of children: Not on file   Years of education: Not on file   Highest education level: Not on file  Occupational History   Not on file  Tobacco Use   Smoking status: Never    Passive exposure: Yes   Smokeless tobacco: Never  Vaping Use   Vaping status: Every Day  Substance and Sexual Activity   Alcohol use: No    Alcohol/week: 0.0 standard drinks of alcohol   Drug use: No   Sexual activity: Never  Other Topics Concern   Not on file  Social History Narrative   Not on file   Social Determinants of Health   Financial Resource Strain: Not on file  Food Insecurity: Not on file  Transportation Needs: Not on file  Physical Activity: Not on file  Stress: Not on file  Social Connections: Not on file  Intimate Partner Violence: Not on file    Review of Systems  All other systems reviewed and are negative.       Objective    BP 120/74   Pulse 72   Temp 98.2 F (36.8 C) (Oral)   Ht 5\' 7"  (1.702 m)   Wt 198 lb 1.6 oz (89.9 kg)   LMP 05/17/2023 (  Exact Date) Comment: reports she is not sexually active  SpO2 97%   BMI 31.03 kg/m   Physical Exam Vitals and nursing note reviewed.  Constitutional:      Appearance: Normal appearance. She is normal weight.  HENT:     Head: Normocephalic and atraumatic.  Cardiovascular:     Rate and Rhythm: Normal rate and regular rhythm.     Pulses: Normal pulses.     Heart sounds: Normal heart sounds.  Pulmonary:     Effort: Pulmonary effort is normal.     Breath sounds: Normal breath sounds.  Skin:    General: Skin is warm and dry.  Neurological:     General: No focal deficit present.     Mental Status: She is alert and oriented to person, place, and time. Mental status is at baseline.  Psychiatric:        Mood and Affect: Mood normal.         Behavior: Behavior normal.        Thought Content: Thought content normal.        Judgment: Judgment normal.         Assessment & Plan:   Problem List Items Addressed This Visit     MDD (major depressive disorder)    Uncontrolled. Continue Lexapro 20mg  daily and increase Wellbutrin XL to 300mg  daily. Denies SI/HI. Encouraged to reestablish care with a therapist. Follow up in 2 weeks.      Relevant Medications   buPROPion (WELLBUTRIN XL) 300 MG 24 hr tablet   Obesity (BMI 30.0-34.9)    Counseled on importance of weight management for overall health. Encouraged low calorie, heart healthy diet and moderate intensity exercise 150 minutes weekly. This is 3-5 times weekly for 30-50 minutes each session. Goal should be pace of 3 miles/hours, or walking 1.5 miles in 30 minutes and include strength training. Discussed risks of obesity. Fasting labs today.      Relevant Orders   CBC with Differential/Platelet   COMPLETE METABOLIC PANEL WITH GFR   Lipid panel   Bacterial vaginosis - Primary    Wet prep positive for clue cells and bacteria in presence of malodorous discharge. Will treat with Flagyl 500mg  BID for 7 days. Counseled on importance of completing full course of antibiotics.       Relevant Medications   metroNIDAZOLE (FLAGYL) 500 MG tablet   Other Visit Diagnoses     Malodorous urine       Relevant Orders   Urinalysis, Routine w reflex microscopic   Screening for lipid disorders       Relevant Orders   Lipid panel   Vaginal odor       Relevant Orders   WET PREP FOR TRICH, YEAST, CLUE       Return in about 1 week (around 07/22/2023) for PAP.   Park Meo, FNP

## 2023-07-15 NOTE — Assessment & Plan Note (Signed)
Counseled on importance of weight management for overall health. Encouraged low calorie, heart healthy diet and moderate intensity exercise 150 minutes weekly. This is 3-5 times weekly for 30-50 minutes each session. Goal should be pace of 3 miles/hours, or walking 1.5 miles in 30 minutes and include strength training. Discussed risks of obesity. Fasting labs today.

## 2023-07-20 ENCOUNTER — Other Ambulatory Visit: Payer: Self-pay | Admitting: Family Medicine

## 2023-07-20 DIAGNOSIS — F321 Major depressive disorder, single episode, moderate: Secondary | ICD-10-CM

## 2023-07-20 DIAGNOSIS — R7989 Other specified abnormal findings of blood chemistry: Secondary | ICD-10-CM

## 2023-07-20 DIAGNOSIS — F411 Generalized anxiety disorder: Secondary | ICD-10-CM

## 2023-07-20 MED ORDER — ESCITALOPRAM OXALATE 20 MG PO TABS
20.0000 mg | ORAL_TABLET | Freq: Every day | ORAL | 1 refills | Status: DC
Start: 2023-07-20 — End: 2024-07-25

## 2023-07-21 LAB — COMPLETE METABOLIC PANEL WITH GFR
AG Ratio: 1.4 (calc) (ref 1.0–2.5)
ALT: 45 U/L — ABNORMAL HIGH (ref 6–29)
AST: 30 U/L (ref 10–30)
Albumin: 4.3 g/dL (ref 3.6–5.1)
Alkaline phosphatase (APISO): 85 U/L (ref 31–125)
BUN: 12 mg/dL (ref 7–25)
CO2: 27 mmol/L (ref 20–32)
Calcium: 9.2 mg/dL (ref 8.6–10.2)
Chloride: 102 mmol/L (ref 98–110)
Creat: 0.82 mg/dL (ref 0.50–0.96)
Globulin: 3 g/dL (ref 1.9–3.7)
Glucose, Bld: 88 mg/dL (ref 65–99)
Potassium: 4.6 mmol/L (ref 3.5–5.3)
Sodium: 137 mmol/L (ref 135–146)
Total Bilirubin: 0.4 mg/dL (ref 0.2–1.2)
Total Protein: 7.3 g/dL (ref 6.1–8.1)
eGFR: 102 mL/min/{1.73_m2} (ref >=60)

## 2023-07-21 LAB — CBC WITH DIFFERENTIAL/PLATELET
Absolute Monocytes: 510 {cells}/uL (ref 200–950)
Basophils Absolute: 42 {cells}/uL (ref 0–200)
Basophils Relative: 0.7 %
Eosinophils Absolute: 60 {cells}/uL (ref 15–500)
Eosinophils Relative: 1 %
HCT: 41.2 % (ref 35.0–45.0)
Hemoglobin: 13.5 g/dL (ref 11.7–15.5)
Lymphs Abs: 2166 {cells}/uL (ref 850–3900)
MCH: 26.8 pg — ABNORMAL LOW (ref 27.0–33.0)
MCHC: 32.8 g/dL (ref 32.0–36.0)
MCV: 81.7 fL (ref 80.0–100.0)
MPV: 9.5 fL (ref 7.5–12.5)
Monocytes Relative: 8.5 %
Neutro Abs: 3222 {cells}/uL (ref 1500–7800)
Neutrophils Relative %: 53.7 %
Platelets: 328 10*3/uL (ref 140–400)
RBC: 5.04 10*6/uL (ref 3.80–5.10)
RDW: 13.4 % (ref 11.0–15.0)
Total Lymphocyte: 36.1 %
WBC: 6 10*3/uL (ref 3.8–10.8)

## 2023-07-21 LAB — LIPID PANEL
Cholesterol: 158 mg/dL (ref <200)
HDL: 46 mg/dL — ABNORMAL LOW (ref >=50)
LDL Cholesterol (Calc): 96 mg/dL
Non-HDL Cholesterol (Calc): 112 mg/dL (ref <130)
Total CHOL/HDL Ratio: 3.4 (calc) (ref <5.0)
Triglycerides: 75 mg/dL (ref <150)

## 2023-07-21 LAB — TEST AUTHORIZATION

## 2023-07-21 LAB — IRON,TIBC AND FERRITIN PANEL
%SAT: 24 % (ref 16–45)
Ferritin: 26 ng/mL (ref 16–154)
Iron: 91 ug/dL (ref 40–190)
TIBC: 386 ug/dL (ref 250–450)

## 2023-07-21 LAB — HEPATITIS B SURFACE ANTIBODY, QUANTITATIVE: Hep B S AB Quant (Post): 5 m[IU]/mL — ABNORMAL LOW (ref >=10)

## 2023-07-21 LAB — HEPATITIS C ANTIBODY: Hepatitis C Ab: NONREACTIVE

## 2023-07-25 ENCOUNTER — Encounter: Payer: Self-pay | Admitting: Family Medicine

## 2023-07-26 ENCOUNTER — Ambulatory Visit (HOSPITAL_COMMUNITY)
Admission: RE | Admit: 2023-07-26 | Discharge: 2023-07-26 | Disposition: A | Discharge status: Home / Self Care | Payer: 59 | Source: Ambulatory Visit | Attending: Family Medicine | Admitting: Family Medicine

## 2023-07-26 ENCOUNTER — Ambulatory Visit: Payer: 59 | Admitting: Family Medicine

## 2023-07-26 ENCOUNTER — Other Ambulatory Visit: Payer: 59

## 2023-07-26 DIAGNOSIS — R7989 Other specified abnormal findings of blood chemistry: Secondary | ICD-10-CM

## 2023-07-27 LAB — IRON,TIBC AND FERRITIN PANEL
%SAT: 17 % (ref 16–45)
Ferritin: 21 ng/mL (ref 16–154)
Iron: 53 ug/dL (ref 40–190)
TIBC: 312 ug/dL (ref 250–450)

## 2023-07-27 LAB — HEPATITIS C ANTIBODY: Hepatitis C Ab: NONREACTIVE

## 2023-07-27 LAB — HEPATITIS B SURFACE ANTIBODY, QUANTITATIVE: Hep B S AB Quant (Post): <5 m[IU]/mL — ABNORMAL LOW (ref >=10)

## 2023-08-04 ENCOUNTER — Ambulatory Visit: Payer: 59 | Admitting: Family Medicine

## 2023-09-06 ENCOUNTER — Ambulatory Visit: Payer: 59 | Admitting: Family Medicine

## 2023-10-28 ENCOUNTER — Ambulatory Visit: Payer: 59 | Admitting: Family Medicine

## 2023-11-08 ENCOUNTER — Encounter: Payer: 59 | Admitting: Family Medicine

## 2023-11-23 ENCOUNTER — Encounter: Payer: 59 | Admitting: Family Medicine

## 2023-11-23 ENCOUNTER — Ambulatory Visit (INDEPENDENT_AMBULATORY_CARE_PROVIDER_SITE_OTHER): Payer: 59 | Admitting: Family Medicine

## 2023-11-23 ENCOUNTER — Encounter: Payer: Self-pay | Admitting: Family Medicine

## 2023-11-23 VITALS — BP 115/70 | HR 93 | Temp 97.4°F | Ht 67.0 in | Wt 166.0 lb

## 2023-11-23 DIAGNOSIS — Z124 Encounter for screening for malignant neoplasm of cervix: Secondary | ICD-10-CM

## 2023-11-23 DIAGNOSIS — N898 Other specified noninflammatory disorders of vagina: Secondary | ICD-10-CM

## 2023-11-23 DIAGNOSIS — Z0001 Encounter for general adult medical examination with abnormal findings: Secondary | ICD-10-CM | POA: Diagnosis not present

## 2023-11-23 DIAGNOSIS — N760 Acute vaginitis: Secondary | ICD-10-CM | POA: Diagnosis not present

## 2023-11-23 DIAGNOSIS — B9689 Other specified bacterial agents as the cause of diseases classified elsewhere: Secondary | ICD-10-CM

## 2023-11-23 DIAGNOSIS — N912 Amenorrhea, unspecified: Secondary | ICD-10-CM

## 2023-11-23 DIAGNOSIS — R87618 Other abnormal cytological findings on specimens from cervix uteri: Secondary | ICD-10-CM | POA: Diagnosis not present

## 2023-11-23 LAB — WET PREP FOR TRICH, YEAST, CLUE

## 2023-11-23 MED ORDER — ALPRAZOLAM 0.5 MG PO TABS: 0.5000 mg | ORAL_TABLET | Freq: Three times a day (TID) | ORAL | 0 refills | Status: AC | PRN

## 2023-11-23 MED ORDER — METRONIDAZOLE 500 MG PO TABS: 500.0000 mg | ORAL_TABLET | Freq: Two times a day (BID) | ORAL | 0 refills | Status: AC

## 2023-11-23 NOTE — Assessment & Plan Note (Signed)
 FSH, TSH, estradiol and prolactin ordered. Referral to OBGYN. She denies possibility of pregnancy due to she is in a same sex relationship.

## 2023-11-23 NOTE — Assessment & Plan Note (Signed)
 Wet prep positive for clue and bacteria. Start Flagyl 500mg  BID x7d.

## 2023-11-23 NOTE — Progress Notes (Signed)
 Subjective:   Christina Conner is a 23 y.o. female for annual routine Pap and checkup. She does report her LMP was in August 2024. She was having regular periods for 4 years prior to that. She denies anorexia, extreme exercise, or stress. She has started taking Semaglutide since October and has lost 25 pounds. Current Outpatient Medications  Medication Sig Dispense Refill   buPROPion  (WELLBUTRIN  XL) 300 MG 24 hr tablet Take 1 tablet (300 mg total) by mouth daily. 90 tablet 1   escitalopram  (LEXAPRO ) 20 MG tablet Take 1 tablet (20 mg total) by mouth daily. 90 tablet 1   metroNIDAZOLE  (FLAGYL ) 500 MG tablet Take 1 tablet (500 mg total) by mouth 2 (two) times daily for 7 days. 14 tablet 0   ALPRAZolam  (XANAX ) 0.5 MG tablet Take 1 tablet (0.5 mg total) by mouth 3 (three) times daily as needed. 30 tablet 0   No current facility-administered medications for this visit.   Allergies: Prozac  [fluoxetine ]  Patient's last menstrual period was 06/17/2023 (approximate). Past Medical History:  Diagnosis Date   Anxiety    Past Surgical History:  Procedure Laterality Date   EYE SURGERY Left 07/17/2014     ROS:  Feeling well. No dyspnea or chest pain on exertion.  No abdominal pain, change in bowel habits, black or bloody stools.  No urinary tract symptoms. GYN ROS: no vaginal bleeding. No neurological complaints.  Objective:   The patient appears well, alert, oriented x 3, in no distress. BP 115/70   Pulse 93   Temp (!) 97.4 F (36.3 C) (Oral)   Ht 5' 7 (1.702 m)   Wt 166 lb (75.3 kg)   LMP 06/17/2023 (Approximate)   SpO2 99%   BMI 26.00 kg/m  ENT normal.  Neck supple. No adenopathy or thyromegaly. PERLA. Lungs are clear, good air entry, no wheezes, rhonchi or rales. S1 and S2 normal, no murmurs, regular rate and rhythm. Abdomen soft without tenderness, guarding, mass or organomegaly. Extremities show no edema, normal peripheral pulses. Neurological is normal, no focal findings.  BREAST  EXAM: not examined  PELVIC EXAM: normal external genitalia, vulva, vagina, cervix, uterus and adnexa  Assessment & Plan:   well woman  PLAN:  pap smear additional lab tests per orders return annually or prn Refer to OBGYN    Bacterial vaginosis Assessment & Plan: Wet prep positive for clue and bacteria. Start Flagyl  500mg  BID x7d.    Cervical cancer screening -     Pap IG and Chlamydia/Gonococcus, NAA -     WET PREP FOR TRICH, YEAST, CLUE  Vaginal discharge -     WET PREP FOR TRICH, YEAST, CLUE  Amenorrhea Assessment & Plan: FSH, TSH, estradiol  and prolactin ordered. Referral to OBGYN. She denies possibility of pregnancy due to she is in a same sex relationship.  Orders: -     Follicle stimulating hormone -     TSH -     Estradiol  -     Prolactin -     Ambulatory referral to Obstetrics / Gynecology  Other orders -     ALPRAZolam ; Take 1 tablet (0.5 mg total) by mouth 3 (three) times daily as needed.  Dispense: 30 tablet; Refill: 0 -     metroNIDAZOLE ; Take 1 tablet (500 mg total) by mouth 2 (two) times daily for 7 days.  Dispense: 14 tablet; Refill: 0     Follow up plan: Return if symptoms worsen or fail to improve.  Jeoffrey GORMAN Barrio, FNP

## 2023-11-24 LAB — ESTRADIOL: Estradiol: 105 pg/mL

## 2023-11-24 LAB — TSH: TSH: 2.44 m[IU]/L

## 2023-11-24 LAB — FOLLICLE STIMULATING HORMONE: FSH: 2 m[IU]/mL

## 2023-11-24 LAB — PROLACTIN: Prolactin: 18.9 ng/mL

## 2023-11-26 LAB — PAP IG AND CT-NG NAA
C. trachomatis RNA, TMA: NOT DETECTED
N. gonorrhoeae RNA, TMA: NOT DETECTED

## 2023-12-09 ENCOUNTER — Ambulatory Visit: Payer: 59 | Admitting: Family Medicine

## 2024-02-24 DIAGNOSIS — Z319 Encounter for procreative management, unspecified: Secondary | ICD-10-CM | POA: Diagnosis not present

## 2024-02-24 DIAGNOSIS — Z31430 Encounter of female for testing for genetic disease carrier status for procreative management: Secondary | ICD-10-CM | POA: Diagnosis not present

## 2024-06-11 DIAGNOSIS — S61452A Open bite of left hand, initial encounter: Secondary | ICD-10-CM | POA: Diagnosis not present

## 2024-06-11 DIAGNOSIS — W540XXA Bitten by dog, initial encounter: Secondary | ICD-10-CM | POA: Diagnosis not present

## 2024-06-11 DIAGNOSIS — S61259A Open bite of unspecified finger without damage to nail, initial encounter: Secondary | ICD-10-CM | POA: Diagnosis not present

## 2024-06-11 DIAGNOSIS — S61215A Laceration without foreign body of left ring finger without damage to nail, initial encounter: Secondary | ICD-10-CM | POA: Diagnosis not present

## 2024-06-11 DIAGNOSIS — S61255A Open bite of left ring finger without damage to nail, initial encounter: Secondary | ICD-10-CM | POA: Diagnosis not present

## 2024-07-18 ENCOUNTER — Other Ambulatory Visit: Payer: Self-pay | Admitting: Family Medicine

## 2024-07-18 DIAGNOSIS — F411 Generalized anxiety disorder: Secondary | ICD-10-CM

## 2024-07-18 DIAGNOSIS — F321 Major depressive disorder, single episode, moderate: Secondary | ICD-10-CM

## 2024-07-25 ENCOUNTER — Telehealth: Admitting: Family Medicine

## 2024-07-25 ENCOUNTER — Encounter: Payer: Self-pay | Admitting: Family Medicine

## 2024-07-25 DIAGNOSIS — F411 Generalized anxiety disorder: Secondary | ICD-10-CM

## 2024-07-25 DIAGNOSIS — F321 Major depressive disorder, single episode, moderate: Secondary | ICD-10-CM | POA: Diagnosis not present

## 2024-07-25 MED ORDER — BUPROPION HCL ER (XL) 300 MG PO TB24: 300.0000 mg | ORAL_TABLET | Freq: Every day | ORAL | 1 refills | Status: AC

## 2024-07-25 MED ORDER — ESCITALOPRAM OXALATE 20 MG PO TABS
20.0000 mg | ORAL_TABLET | Freq: Every day | ORAL | 1 refills | Status: AC
Start: 2024-07-25 — End: ?

## 2024-07-25 NOTE — Assessment & Plan Note (Signed)
 Well controlled on Lexapro  20mg  daily and Wellbutrin  XL 300mg  daily. GAD 0, PHQ 0, denies SI/HI. Follow up for CPE.

## 2024-07-25 NOTE — Progress Notes (Signed)
 Virtual Visit via Video note  I connected with Christina Conner on 07/25/24 at 0950 by video and verified that I am speaking with the correct person using two identifiers. Christina Conner is currently located at home and no one is currently with her during visit. The provider, Jeoffrey GORMAN Barrio, FNP is located in their office at time of visit.  I discussed the limitations, risks, security and privacy concerns of performing an evaluation and management service by video and the availability of in person appointments. I also discussed with the patient that there may be a patient responsible charge related to this service. The patient expressed understanding and agreed to proceed.  Subjective: PCP: Barrio Jeoffrey GORMAN, FNP  No chief complaint on file.   HPI Pt connecting today for management of her anxiety and depression and medication refill request. She reports today these are well controlled on current regimen of Lexapro  20mg  daily and Wellbutrin  XL 300mg  daily. Denies SI/HI, insomnia, hallucinations, delusions. Is not seeing a therapist.     07/25/2024    9:53 AM 11/23/2023    2:05 PM 07/15/2023   11:15 AM 09/28/2022    3:05 PM  GAD 7 : Generalized Anxiety Score  Nervous, Anxious, on Edge 0 2 2 0  Control/stop worrying 0 2 2 0  Worry too much - different things 0 2 2 0  Trouble relaxing 0 2 2 0  Restless 0 2 2 0  Easily annoyed or irritable 0 2 2 0  Afraid - awful might happen 0 2 2 0  Total GAD 7 Score 0 14 14 0  Anxiety Difficulty Not difficult at all Very difficult Very difficult Not difficult at all       07/25/2024    9:52 AM 11/23/2023    1:53 PM 07/15/2023   11:13 AM 09/28/2022    3:05 PM 03/04/2022   11:18 AM  Depression screen PHQ 2/9  Decreased Interest 0 2 2 0 2  Down, Depressed, Hopeless 0 2 2 0 1  PHQ - 2 Score 0 4 4 0 3  Altered sleeping  3 2 0 1  Tired, decreased energy  3 3 0 1  Change in appetite  1 3 0 0  Feeling bad or failure about yourself   2 3 0 1  Trouble  concentrating  3 2 0 0  Moving slowly or fidgety/restless  2 2 0 0  Suicidal thoughts  1 1 0 0  PHQ-9 Score  19 20 0 6  Difficult doing work/chores  Very difficult Very difficult Not difficult at all Somewhat difficult     ROS: Per HPI  Current Outpatient Medications:    ALPRAZolam  (XANAX ) 0.5 MG tablet, Take 1 tablet (0.5 mg total) by mouth 3 (three) times daily as needed., Disp: 30 tablet, Rfl: 0   buPROPion  (WELLBUTRIN  XL) 300 MG 24 hr tablet, Take 1 tablet (300 mg total) by mouth daily., Disp: 90 tablet, Rfl: 1   escitalopram  (LEXAPRO ) 20 MG tablet, Take 1 tablet (20 mg total) by mouth daily., Disp: 90 tablet, Rfl: 1  Observations/Objective: Physical Exam Constitutional:      General: She is not in acute distress.    Appearance: Normal appearance. She is not toxic-appearing.  Eyes:     Conjunctiva/sclera: Conjunctivae normal.  Pulmonary:     Effort: Pulmonary effort is normal. No respiratory distress.  Skin:    Coloration: Skin is not pale.  Neurological:     General: No focal deficit  present.     Mental Status: She is alert and oriented to person, place, and time.  Psychiatric:        Mood and Affect: Mood normal.        Behavior: Behavior normal.        Thought Content: Thought content normal.        Judgment: Judgment normal.    Assessment and Plan: There are no diagnoses linked to this encounter.  Follow Up Instructions: No follow-ups on file.   I discussed the assessment and treatment plan with the patient. The patient was provided an opportunity to ask questions and all were answered. The patient agreed with the plan and demonstrated an understanding of the instructions.   The patient was advised to call back or seek an in-person evaluation if the symptoms worsen or if the condition fails to improve as anticipated.  The above assessment and management plan was discussed with the patient. The patient verbalized understanding of and has agreed to the management  plan. Patient is aware to call the clinic if symptoms persist or worsen. Patient is aware when to return to the clinic for a follow-up visit. Patient educated on when it is appropriate to go to the emergency department.   Time call ended: 1001  I provided 11 minutes of face-to-face time during this encounter.   Jeoffrey Barrio, MSN, APRN, FNP-C [facility]

## 2024-08-14 ENCOUNTER — Encounter: Admitting: Family Medicine

## 2024-08-15 NOTE — Addendum Note (Signed)
 Addended by: KAYLA JEOFFREY RAMAN on: 08/15/2024 10:35 AM   Modules accepted: Level of Service
